# Patient Record
Sex: Female | Born: 1945 | Race: White | Hispanic: No | Marital: Married | Smoking: Never smoker
Health system: Southern US, Community
[De-identification: ages and names within clinical notes are randomized; demographics above are authoritative.]

## PROBLEM LIST (undated history)

## (undated) ENCOUNTER — Emergency Department (HOSPITAL_COMMUNITY): Payer: Self-pay | Source: Home / Self Care

## (undated) DIAGNOSIS — I729 Aneurysm of unspecified site: Secondary | ICD-10-CM

## (undated) DIAGNOSIS — K589 Irritable bowel syndrome without diarrhea: Secondary | ICD-10-CM

## (undated) DIAGNOSIS — Z860100 Personal history of colon polyps, unspecified: Secondary | ICD-10-CM

## (undated) DIAGNOSIS — G8929 Other chronic pain: Secondary | ICD-10-CM

## (undated) DIAGNOSIS — R51 Headache: Secondary | ICD-10-CM

## (undated) DIAGNOSIS — R519 Headache, unspecified: Secondary | ICD-10-CM

## (undated) DIAGNOSIS — I10 Essential (primary) hypertension: Secondary | ICD-10-CM

## (undated) DIAGNOSIS — K219 Gastro-esophageal reflux disease without esophagitis: Secondary | ICD-10-CM

## (undated) DIAGNOSIS — J449 Chronic obstructive pulmonary disease, unspecified: Secondary | ICD-10-CM

## (undated) DIAGNOSIS — F419 Anxiety disorder, unspecified: Secondary | ICD-10-CM

## (undated) DIAGNOSIS — E785 Hyperlipidemia, unspecified: Secondary | ICD-10-CM

## (undated) HISTORY — PX: CATARACT EXTRACTION: SUR2

## (undated) HISTORY — DX: Gastro-esophageal reflux disease without esophagitis: K21.9

## (undated) HISTORY — DX: Anxiety disorder, unspecified: F41.9

## (undated) HISTORY — PX: CEREBRAL ANEURYSM REPAIR: SHX164

## (undated) HISTORY — DX: Personal history of colon polyps, unspecified: Z86.0100

## (undated) HISTORY — DX: Headache, unspecified: G89.29

## (undated) HISTORY — DX: Aneurysm of unspecified site: I72.9

## (undated) HISTORY — DX: Essential (primary) hypertension: I10

## (undated) HISTORY — DX: Hyperlipidemia, unspecified: E78.5

## (undated) HISTORY — DX: Personal history of colonic polyps: Z86.010

## (undated) HISTORY — DX: Irritable bowel syndrome, unspecified: K58.9

## (undated) HISTORY — DX: Headache: R51

## (undated) HISTORY — DX: Headache, unspecified: R51.9

## (undated) HISTORY — DX: Chronic obstructive pulmonary disease, unspecified: J44.9

---

## 1981-09-26 HISTORY — PX: TOTAL ABDOMINAL HYSTERECTOMY: SHX209

## 1998-01-19 ENCOUNTER — Other Ambulatory Visit: Admission: RE | Admit: 1998-01-19 | Discharge: 1998-01-19 | Payer: Self-pay | Admitting: Obstetrics and Gynecology

## 1998-09-26 DIAGNOSIS — I729 Aneurysm of unspecified site: Secondary | ICD-10-CM | POA: Insufficient documentation

## 1998-09-26 HISTORY — DX: Aneurysm of unspecified site: I72.9

## 1999-08-31 ENCOUNTER — Other Ambulatory Visit: Admission: RE | Admit: 1999-08-31 | Discharge: 1999-08-31 | Payer: Self-pay | Admitting: Obstetrics and Gynecology

## 1999-09-27 HISTORY — PX: CHOLECYSTECTOMY: SHX55

## 2013-05-15 ENCOUNTER — Encounter: Payer: Self-pay | Admitting: Internal Medicine

## 2013-05-16 ENCOUNTER — Ambulatory Visit (INDEPENDENT_AMBULATORY_CARE_PROVIDER_SITE_OTHER): Payer: Medicare Other | Admitting: Internal Medicine

## 2013-05-16 ENCOUNTER — Encounter: Payer: Self-pay | Admitting: Internal Medicine

## 2013-05-16 VITALS — BP 122/81 | HR 53 | Temp 98.3°F | Ht 65.5 in | Wt 177.0 lb

## 2013-05-16 DIAGNOSIS — I10 Essential (primary) hypertension: Secondary | ICD-10-CM

## 2013-05-16 DIAGNOSIS — R0609 Other forms of dyspnea: Secondary | ICD-10-CM

## 2013-05-16 DIAGNOSIS — R0600 Dyspnea, unspecified: Secondary | ICD-10-CM

## 2013-05-16 MED ORDER — NEBIVOLOL HCL 5 MG PO TABS: 5.0000 mg | ORAL_TABLET | Freq: Every day | ORAL | Status: DC

## 2013-05-16 MED ORDER — FAMOTIDINE 20 MG PO TABS: ORAL_TABLET | ORAL | Status: DC

## 2013-05-16 NOTE — Progress Notes (Signed)
  Subjective:    Patient ID: Abigail Foster, female    DOB: 1945-11-27   MRN: [id number]  HPI  39 yowf never smoker but freq bronchitis starting around the age of 33 typically starts as cold and then needs to go to doctor for abx maybe once a year and sob swimming for decades referred by Dr Abner Greenspan for new doe to pulmonary clinic  05/16/2013 for sob.   05/16/2013 1st pulmonary eval cc doe uphills on usual work x one year indolent progressive assoc with increase in noct dry cough and nasal congestion . rx with tudorza but not sure now to use it.. Assoc with voice fatigue. W/u so far has shown restriction with low dlco but no xrays avail at ov.   Denies h/o connective tissue dz, amiodarone or chemo or macrodantin exposure.   No obvious daytime variabilty or assoc  cp or chest tightness, subjective wheeze overt sinus or hb symptoms. No unusual exp hx or h/o childhood pna/ asthma or knowledge of premature birth.  Sleeping ok without nocturnal  or early am exacerbation  of respiratory  c/o's or need for noct saba. Also denies any obvious fluctuation of symptoms with weather or environmental changes or other aggravating or alleviating factors except as outlined above      Review of Systems  Constitutional: Negative for fever and unexpected weight change.  HENT: Positive for ear pain, congestion, sneezing and postnasal drip. Negative for nosebleeds, sore throat, rhinorrhea, trouble swallowing, dental problem and sinus pressure.   Eyes: Negative for redness and itching.  Respiratory: Positive for cough, shortness of breath and wheezing. Negative for chest tightness.   Cardiovascular: Negative for palpitations and leg swelling.  Gastrointestinal: Negative for nausea and vomiting.  Genitourinary: Negative for dysuria.  Musculoskeletal: Negative for joint swelling.  Skin: Negative for rash.  Neurological: Positive for headaches.  Hematological: Does not bruise/bleed easily.  Psychiatric/Behavioral:  Positive for dysphoric mood. The patient is not nervous/anxious.        Objective:   Physical Exam  Wt Readings from Last 3 Encounters:  05/16/13 177 lb (80.287 kg)     amb wf nad   HEENT: nl dentition, turbinates, and orophanx. Nl external ear canals without cough reflex   NECK :  without JVD/Nodes/TM/ nl carotid upstrokes bilaterally   LUNGS: no acc muscle use, clear to A and P bilaterally without cough on insp or exp maneuvers   CV:  RRR  no s3 or murmur or increase in P2, no edema   ABD:  soft and nontender with nl excursion in the supine position. No bruits or organomegaly, bowel sounds nl  MS:  warm without deformities, calf tenderness, cyanosis or clubbing  SKIN: warm and dry without lesions    NEURO:  alert, approp, no deficits    xrays not avail at ov      Assessment & Plan:

## 2013-05-16 NOTE — Patient Instructions (Addendum)
Prilosec 20 mg take 2 Take 30-60 min before first meal of the day and pepcid ac 20 mg at bedtime  Stop coreg (corevidol)  Bystolic 5 mg one daily in place of corevidol  GERD (REFLUX)  is an extremely common cause of respiratory symptoms, many times with no significant heartburn at all.    It can be treated with medication, but also with lifestyle changes including avoidance of late meals, excessive alcohol, smoking cessation, and avoid fatty foods, chocolate, peppermint, colas, red wine, and acidic juices such as orange juice.  NO MINT OR MENTHOL PRODUCTS SO NO COUGH DROPS  USE SUGARLESS CANDY INSTEAD (jolley ranchers or Stover's)  NO OIL BASED VITAMINS - use powdered substitutes - Stop krill oil   Stop tudorza if you don't feel it's helping   Please schedule a follow up office visit in 4 weeks, sooner if needed with pft's

## 2013-05-19 DIAGNOSIS — R0600 Dyspnea, unspecified: Secondary | ICD-10-CM | POA: Insufficient documentation

## 2013-05-19 HISTORY — DX: Dyspnea, unspecified: R06.00

## 2013-05-19 NOTE — Assessment & Plan Note (Addendum)
PFT's reviewed and suggest restriction rather than obst but note symptoms are markedly disproportionate to objective findings and not clear this is a lung problem but pt does appear to have difficult airway management issues.  DDX of  difficult airways managment all start with A and  include Adherence, Ace Inhibitors, Acid Reflux, Active Sinus Disease, Alpha 1 Antitripsin deficiency, Anxiety masquerading as Airways dz,  ABPA,  allergy(esp in young), Aspiration (esp in elderly), Adverse effects of DPI,  Active smokers, plus two Bs  = Bronchiectasis and Beta blocker use..and one C= CHF  Will try on gerd rx and change her BB to bystolic and then regroup with pft's on return as well as request ct report from [facility]

## 2013-05-23 ENCOUNTER — Telehealth: Payer: Self-pay | Admitting: Internal Medicine

## 2013-05-23 NOTE — Telephone Encounter (Signed)
I spoke with pt and made aware of recs. Nothing further needed

## 2013-05-23 NOTE — Telephone Encounter (Signed)
I spoke with pt. She stated she took 2 Prilosec's in the AM she feels nauseated. She states it last for couple hours. She stated she noticed this about 2 days ago. The nausea lasts on and off all day. She is wanting to know if she take 1 in the AM and 1 before lunch. Please advise MW thanks

## 2013-05-23 NOTE — Telephone Encounter (Signed)
Try  Take 30- 60 min before your first and last meals of the day

## 2013-05-28 ENCOUNTER — Encounter: Payer: Self-pay | Admitting: Internal Medicine

## 2013-05-28 DIAGNOSIS — R911 Solitary pulmonary nodule: Secondary | ICD-10-CM

## 2013-05-28 HISTORY — DX: Solitary pulmonary nodule: R91.1

## 2013-06-13 ENCOUNTER — Telehealth: Payer: Self-pay | Admitting: Internal Medicine

## 2013-06-13 MED ORDER — NEBIVOLOL HCL 5 MG PO TABS: 5.0000 mg | ORAL_TABLET | Freq: Every day | ORAL | Status: DC

## 2013-06-13 NOTE — Telephone Encounter (Signed)
Pt advised that she needs to continue bystolic and rx sent. Carron Curie, CMA

## 2013-06-27 ENCOUNTER — Ambulatory Visit (INDEPENDENT_AMBULATORY_CARE_PROVIDER_SITE_OTHER): Payer: Medicare Other | Admitting: Internal Medicine

## 2013-06-27 ENCOUNTER — Encounter: Payer: Self-pay | Admitting: Internal Medicine

## 2013-06-27 VITALS — BP 116/64 | HR 50 | Temp 97.7°F | Ht 64.25 in | Wt 176.0 lb

## 2013-06-27 DIAGNOSIS — J45909 Unspecified asthma, uncomplicated: Secondary | ICD-10-CM

## 2013-06-27 DIAGNOSIS — I10 Essential (primary) hypertension: Secondary | ICD-10-CM

## 2013-06-27 DIAGNOSIS — R911 Solitary pulmonary nodule: Secondary | ICD-10-CM

## 2013-06-27 DIAGNOSIS — Z23 Encounter for immunization: Secondary | ICD-10-CM

## 2013-06-27 DIAGNOSIS — R0600 Dyspnea, unspecified: Secondary | ICD-10-CM

## 2013-06-27 DIAGNOSIS — R0609 Other forms of dyspnea: Secondary | ICD-10-CM

## 2013-06-27 HISTORY — DX: Unspecified asthma, uncomplicated: J45.909

## 2013-06-27 LAB — PULMONARY FUNCTION TEST

## 2013-06-27 MED ORDER — MOMETASONE FURO-FORMOTEROL FUM 100-5 MCG/ACT IN AERO: INHALATION_SPRAY | RESPIRATORY_TRACT | Status: DC

## 2013-06-27 NOTE — Patient Instructions (Addendum)
Try dulera 100 Take 1-2 puffs first thing in am and then another 2 puffs about 12 hours later.   If nausea and diarrhea don't improve try stopping the prilosec (omeprazole) and use pepcid 20 mg twice daily    If you are satisfied with your treatment plan let your doctor know and he/she can either refill your medications or you can return here when your prescription runs out.     If in any way you are not 100% satisfied,  please tell us.  If 100% better, tell your friends!

## 2013-06-27 NOTE — Progress Notes (Signed)
Subjective:    Patient ID: Abigail Foster, female    DOB: 1946-04-07   MRN: [id number]    Brief patient profile:  89 yowf never smoker but freq bronchitis starting around the age of 84 typically starts as cold and then needs to go to doctor for abx maybe once a year and sob swimming for decades referred by Dr Abner Greenspan for new doe to pulmonary clinic  05/16/2013 with sob ? etiology  History of Present Illness  05/16/2013 1st pulmonary eval cc doe uphills on usual walk x one year indolent progressive assoc with increase in noct dry cough and nasal congestion . rx with tudorza but not sure now to use it.. Assoc with voice fatigue. W/u so far has shown restriction with low dlco but no xrays avail at ov. rec Prilosec 20 mg take 2 Take 30-60 min before first meal of the day and pepcid ac 20 mg at bedtime Stop coreg (corevidol) Bystolic 5 mg one daily in place of corevidol GERD    Stop tudorza if you don't feel it's helping        06/27/2013 f/u ov/Tanaysha Alkins re: ? pf Chief Complaint  Patient presents with  . Followup with PFT    Pt states that her cough and SOB are better. She does c/o nausea and diarrhea and she relates this to new meds- omeprazole or bystolic.   uphills better doe, cough some better and tends to flare while sleeping    Denies h/o connective tissue dz, amiodarone or chemo or macrodantin exposure.   No obvious day to day or daytime variabilty or assoc chronic cough or cp or chest tightness, subjective wheeze overt sinus or hb symptoms. No unusual exp hx or h/o childhood pna/ asthma or knowledge of premature birth.  Sleeping ok without nocturnal  or early am exacerbation  of respiratory  c/o's or need for noct saba. Also denies any obvious fluctuation of symptoms with weather or environmental changes or other aggravating or alleviating factors except as outlined above   Current Medications, Allergies, Complete Past Medical History, Past Surgical History, Family History, and Social  History were reviewed in [employer] record.  ROS  The following are not active complaints unless bolded sore throat, dysphagia, dental problems, itching, sneezing,  nasal congestion or excess/ purulent secretions, ear ache,   fever, chills, sweats, unintended wt loss, pleuritic or exertional cp, hemoptysis,  orthopnea pnd or leg swelling, presyncope, palpitations, heartburn, abdominal pain, anorexia, nausea, vomiting, diarrhea  or change in bowel or urinary habits, change in stools or urine, dysuria,hematuria,  rash, arthralgias, visual complaints, headache, numbness weakness or ataxia or problems with walking or coordination,  change in mood/affect or memory.                 Objective:   Physical Exam  Wt Readings from Last 3 Encounters:  06/27/13 176 lb (79.833 kg)  05/16/13 177 lb (80.287 kg)        amb wf nad   HEENT: nl dentition, turbinates, and orophanx. Nl external ear canals without cough reflex   NECK :  without JVD/Nodes/TM/ nl carotid upstrokes bilaterally   LUNGS: no acc muscle use, clear to A and P bilaterally without cough on insp or exp maneuvers   CV:  RRR  no s3 or murmur or increase in P2, no edema   ABD:  soft and nontender with nl excursion in the supine position. No bruits or organomegaly, bowel sounds nl  MS:  warm  without deformities, calf tenderness, cyanosis or clubbing  SKIN: warm and dry without lesions    NEURO:  alert, approp, no deficits    04/29/13 CT chest report 5 mm nodule only, no emphysema, no ILD      Assessment & Plan:

## 2013-06-27 NOTE — Progress Notes (Signed)
PFT done today. 

## 2013-06-30 DIAGNOSIS — I119 Hypertensive heart disease without heart failure: Secondary | ICD-10-CM | POA: Insufficient documentation

## 2013-06-30 DIAGNOSIS — I10 Essential (primary) hypertension: Secondary | ICD-10-CM | POA: Insufficient documentation

## 2013-06-30 HISTORY — DX: Essential (primary) hypertension: I10

## 2013-06-30 NOTE — Assessment & Plan Note (Addendum)
-   PFT's 06/27/2013  FEV1 1.68 (69%) ratio 66 and 12% better p B2 and DLCO 71 corrects to 81  - hfa 50% p coaching 06/27/2013   Her symptoms have improved off coreg and on ppi but based on pft's the d/c coreg was probably more important and her recurrent bronchitis suggest more of a chronic asthma with flare pattern, though time will tell if this is correct  For now rec maintain on the most selective BB's on the market (see HBP) and on dulera 100 1-2 bid and reduce acid suppression to just pepcid as may be having GI side effects from PPI  The proper method of use, as well as anticipated side effects, of a metered-dose inhaler are discussed and demonstrated to the patient. Improved effectiveness after extensive coaching during this visit to a level of approximately  50%     Each maintenance medication was reviewed in detail including most importantly the difference between maintenance and as needed and under what circumstances the prns are to be used.  Please see instructions for details which were reviewed in writing and the patient given a copy.

## 2013-06-30 NOTE — Assessment & Plan Note (Signed)
D/c coreg 05/20/13 > better 06/27/13 with reversible airflow obst on pfts  So Strongly prefer in this setting: Bystolic, the most beta -1  selective Beta blocker available in sample form, with bisoprolol the most selective generic choice  on the market.

## 2013-06-30 NOTE — Assessment & Plan Note (Signed)
CT chest 04/26/13 :   5mm LUL nodule, never smoker so 12 month f/u rec and entered into tickle file for recall 04/29/14

## 2013-07-01 NOTE — Assessment & Plan Note (Signed)
Strongly prefer in this setting: Bystolic, the most beta -1  selective Beta blocker available in sample form, with bisoprolol the most selective generic choice  on the market.  

## 2013-07-16 ENCOUNTER — Telehealth: Payer: Self-pay | Admitting: Internal Medicine

## 2013-07-16 NOTE — Telephone Encounter (Signed)
Pt states is not doing well on Breo and would like to switch back to [place]. Also, wants to know if generic for bystolic and would like a [facility] supply [facility] Please advise

## 2013-07-16 NOTE — Telephone Encounter (Signed)
Ok to try both breo and [place] combined before stop breo just to see if the two together due more than either alone  bystolic should be relatively cheap on the [facility] plan and the replacement in not a perfect fit but can try bisoprolol 5 mg daily and recheck bp 1 weeks after change Tammy NP or primary

## 2013-07-17 MED ORDER — NEBIVOLOL HCL 5 MG PO TABS: 5.0000 mg | ORAL_TABLET | Freq: Every day | ORAL | Status: DC

## 2013-07-17 NOTE — Telephone Encounter (Signed)
Pt states that she doesn't want to take Breo anymore. Something in the inhaler is causing her to be short of breath and feels like she is going to pass out. She will take [place] by itself.  [facility]stolic will be [facility] per the pt's request.

## 2013-07-18 ENCOUNTER — Telehealth: Payer: Self-pay | Admitting: Internal Medicine

## 2013-07-18 MED ORDER — NEBIVOLOL HCL 5 MG PO TABS: 5.0000 mg | ORAL_TABLET | Freq: Every day | ORAL | Status: DC

## 2013-07-18 NOTE — Telephone Encounter (Signed)
Per phone note 07/16/13: pt requested rx for bystolic to be sent [facility].   I called and spoke with pt. She reports [facility] called her and advised her they could not fill her BP medication bc she was not under them. She reports she did not request RX to go to them but to [facility]. I apologized to the pt and advised her will send this to [facility]. She voiced her understanding and needed nothing further

## 2014-04-15 ENCOUNTER — Telehealth: Payer: Self-pay | Admitting: *Deleted

## 2014-04-15 NOTE — Telephone Encounter (Signed)
Message copied by Rosana Berger on Tue Apr 15, 2014  1:05 PM ------      Message from: Christinia Gully B      Created: Tue May 28, 2013  8:34 AM       Needs limited ct no contrast f/u LUL nodule ------

## 2014-04-15 NOTE — Telephone Encounter (Signed)
Called the pt to remind her ct chest due in Aug 2015 [facility]

## 2014-04-16 ENCOUNTER — Encounter: Payer: Self-pay | Admitting: Internal Medicine

## 2014-04-16 NOTE — Telephone Encounter (Signed)
Pt refuses to have repeat CT chest. Aware that this is the recommendation of Dr Melvyn Novas  Solitary pulmonary nodule - Tanda Rockers, MD at 06/30/2013 8:21 AM     Status: Written Related Problem: Solitary pulmonary nodule    CT chest 04/26/13 : 33mm LUL nodule, never smoker so 12 month f/u rec and entered into tickle file for recall 04/29/14   Pt using Tudorza daily as directed   Please advise Dr Melvyn Novas if anything further needed for this patient. Thanks.

## 2014-04-16 NOTE — Telephone Encounter (Signed)
Ok but send copy of this note to Dr Nyra Capes - pt is low risk as never smoked

## 2014-04-16 NOTE — Telephone Encounter (Signed)
Will forward note to Dr. Marco Collie.

## 2014-04-16 NOTE — Telephone Encounter (Signed)
Pt is returning [name]'s call.  Abigail Foster

## 2014-12-22 DIAGNOSIS — M7631 Iliotibial band syndrome, right leg: Secondary | ICD-10-CM | POA: Diagnosis not present

## 2014-12-22 DIAGNOSIS — H6120 Impacted cerumen, unspecified ear: Secondary | ICD-10-CM | POA: Diagnosis not present

## 2014-12-22 DIAGNOSIS — I10 Essential (primary) hypertension: Secondary | ICD-10-CM | POA: Diagnosis not present

## 2014-12-22 DIAGNOSIS — Z6830 Body mass index (BMI) 30.0-30.9, adult: Secondary | ICD-10-CM | POA: Diagnosis not present

## 2014-12-22 DIAGNOSIS — E782 Mixed hyperlipidemia: Secondary | ICD-10-CM | POA: Diagnosis not present

## 2014-12-31 DIAGNOSIS — R7309 Other abnormal glucose: Secondary | ICD-10-CM | POA: Diagnosis not present

## 2014-12-31 DIAGNOSIS — Z0000 Encounter for general adult medical examination without abnormal findings: Secondary | ICD-10-CM | POA: Diagnosis not present

## 2014-12-31 DIAGNOSIS — Z1389 Encounter for screening for other disorder: Secondary | ICD-10-CM | POA: Diagnosis not present

## 2014-12-31 DIAGNOSIS — Z139 Encounter for screening, unspecified: Secondary | ICD-10-CM | POA: Diagnosis not present

## 2015-01-05 DIAGNOSIS — N182 Chronic kidney disease, stage 2 (mild): Secondary | ICD-10-CM | POA: Diagnosis not present

## 2015-01-05 DIAGNOSIS — R7309 Other abnormal glucose: Secondary | ICD-10-CM | POA: Diagnosis not present

## 2015-01-05 DIAGNOSIS — E785 Hyperlipidemia, unspecified: Secondary | ICD-10-CM | POA: Diagnosis not present

## 2015-01-05 DIAGNOSIS — I129 Hypertensive chronic kidney disease with stage 1 through stage 4 chronic kidney disease, or unspecified chronic kidney disease: Secondary | ICD-10-CM | POA: Diagnosis not present

## 2015-01-08 DIAGNOSIS — H6120 Impacted cerumen, unspecified ear: Secondary | ICD-10-CM | POA: Diagnosis not present

## 2015-01-14 DIAGNOSIS — J449 Chronic obstructive pulmonary disease, unspecified: Secondary | ICD-10-CM | POA: Diagnosis not present

## 2015-04-21 DIAGNOSIS — Z6827 Body mass index (BMI) 27.0-27.9, adult: Secondary | ICD-10-CM | POA: Diagnosis not present

## 2015-04-21 DIAGNOSIS — K589 Irritable bowel syndrome without diarrhea: Secondary | ICD-10-CM | POA: Diagnosis not present

## 2015-04-23 DIAGNOSIS — I10 Essential (primary) hypertension: Secondary | ICD-10-CM | POA: Diagnosis not present

## 2015-04-23 DIAGNOSIS — E785 Hyperlipidemia, unspecified: Secondary | ICD-10-CM | POA: Diagnosis not present

## 2015-05-07 DIAGNOSIS — Z6827 Body mass index (BMI) 27.0-27.9, adult: Secondary | ICD-10-CM | POA: Diagnosis not present

## 2015-05-07 DIAGNOSIS — E785 Hyperlipidemia, unspecified: Secondary | ICD-10-CM | POA: Diagnosis not present

## 2015-05-07 DIAGNOSIS — I129 Hypertensive chronic kidney disease with stage 1 through stage 4 chronic kidney disease, or unspecified chronic kidney disease: Secondary | ICD-10-CM | POA: Diagnosis not present

## 2015-05-07 DIAGNOSIS — N182 Chronic kidney disease, stage 2 (mild): Secondary | ICD-10-CM | POA: Diagnosis not present

## 2015-05-28 DIAGNOSIS — Z1329 Encounter for screening for other suspected endocrine disorder: Secondary | ICD-10-CM | POA: Diagnosis not present

## 2015-05-28 DIAGNOSIS — H6120 Impacted cerumen, unspecified ear: Secondary | ICD-10-CM | POA: Diagnosis not present

## 2015-05-28 DIAGNOSIS — R232 Flushing: Secondary | ICD-10-CM | POA: Diagnosis not present

## 2015-05-28 DIAGNOSIS — H6692 Otitis media, unspecified, left ear: Secondary | ICD-10-CM | POA: Diagnosis not present

## 2015-07-02 DIAGNOSIS — Z23 Encounter for immunization: Secondary | ICD-10-CM | POA: Diagnosis not present

## 2015-09-03 DIAGNOSIS — I129 Hypertensive chronic kidney disease with stage 1 through stage 4 chronic kidney disease, or unspecified chronic kidney disease: Secondary | ICD-10-CM | POA: Diagnosis not present

## 2015-09-03 DIAGNOSIS — N182 Chronic kidney disease, stage 2 (mild): Secondary | ICD-10-CM | POA: Diagnosis not present

## 2015-09-03 DIAGNOSIS — E785 Hyperlipidemia, unspecified: Secondary | ICD-10-CM | POA: Diagnosis not present

## 2015-09-07 DIAGNOSIS — Z0000 Encounter for general adult medical examination without abnormal findings: Secondary | ICD-10-CM | POA: Diagnosis not present

## 2015-09-07 DIAGNOSIS — Z9181 History of falling: Secondary | ICD-10-CM | POA: Diagnosis not present

## 2015-09-07 DIAGNOSIS — Z1159 Encounter for screening for other viral diseases: Secondary | ICD-10-CM | POA: Diagnosis not present

## 2015-09-07 DIAGNOSIS — Z1211 Encounter for screening for malignant neoplasm of colon: Secondary | ICD-10-CM | POA: Diagnosis not present

## 2015-09-07 DIAGNOSIS — Z1231 Encounter for screening mammogram for malignant neoplasm of breast: Secondary | ICD-10-CM | POA: Diagnosis not present

## 2015-09-11 DIAGNOSIS — R7303 Prediabetes: Secondary | ICD-10-CM | POA: Diagnosis not present

## 2015-09-11 DIAGNOSIS — Z6828 Body mass index (BMI) 28.0-28.9, adult: Secondary | ICD-10-CM | POA: Diagnosis not present

## 2015-09-11 DIAGNOSIS — I10 Essential (primary) hypertension: Secondary | ICD-10-CM | POA: Diagnosis not present

## 2015-09-11 DIAGNOSIS — E785 Hyperlipidemia, unspecified: Secondary | ICD-10-CM | POA: Diagnosis not present

## 2015-09-17 DIAGNOSIS — Z1231 Encounter for screening mammogram for malignant neoplasm of breast: Secondary | ICD-10-CM | POA: Diagnosis not present

## 2015-10-29 DIAGNOSIS — Z1211 Encounter for screening for malignant neoplasm of colon: Secondary | ICD-10-CM | POA: Diagnosis not present

## 2015-10-29 DIAGNOSIS — Z86010 Personal history of colonic polyps: Secondary | ICD-10-CM | POA: Diagnosis not present

## 2015-10-29 DIAGNOSIS — H2703 Aphakia, bilateral: Secondary | ICD-10-CM | POA: Diagnosis not present

## 2015-12-03 DIAGNOSIS — Z86010 Personal history of colonic polyps: Secondary | ICD-10-CM | POA: Diagnosis not present

## 2015-12-03 DIAGNOSIS — E7800 Pure hypercholesterolemia, unspecified: Secondary | ICD-10-CM | POA: Diagnosis not present

## 2015-12-03 DIAGNOSIS — K635 Polyp of colon: Secondary | ICD-10-CM | POA: Diagnosis not present

## 2015-12-03 DIAGNOSIS — Z1211 Encounter for screening for malignant neoplasm of colon: Secondary | ICD-10-CM | POA: Diagnosis not present

## 2015-12-03 DIAGNOSIS — K589 Irritable bowel syndrome without diarrhea: Secondary | ICD-10-CM | POA: Diagnosis not present

## 2015-12-03 DIAGNOSIS — E785 Hyperlipidemia, unspecified: Secondary | ICD-10-CM | POA: Diagnosis not present

## 2015-12-03 DIAGNOSIS — K648 Other hemorrhoids: Secondary | ICD-10-CM | POA: Diagnosis not present

## 2015-12-03 DIAGNOSIS — I10 Essential (primary) hypertension: Secondary | ICD-10-CM | POA: Diagnosis not present

## 2015-12-03 DIAGNOSIS — K5730 Diverticulosis of large intestine without perforation or abscess without bleeding: Secondary | ICD-10-CM | POA: Diagnosis not present

## 2015-12-03 DIAGNOSIS — J45909 Unspecified asthma, uncomplicated: Secondary | ICD-10-CM | POA: Diagnosis not present

## 2015-12-03 DIAGNOSIS — D125 Benign neoplasm of sigmoid colon: Secondary | ICD-10-CM | POA: Diagnosis not present

## 2015-12-03 HISTORY — PX: COLONOSCOPY: SHX5424

## 2016-01-07 DIAGNOSIS — I129 Hypertensive chronic kidney disease with stage 1 through stage 4 chronic kidney disease, or unspecified chronic kidney disease: Secondary | ICD-10-CM | POA: Diagnosis not present

## 2016-01-07 DIAGNOSIS — N182 Chronic kidney disease, stage 2 (mild): Secondary | ICD-10-CM | POA: Diagnosis not present

## 2016-01-07 DIAGNOSIS — Z139 Encounter for screening, unspecified: Secondary | ICD-10-CM | POA: Diagnosis not present

## 2016-01-07 DIAGNOSIS — Z1389 Encounter for screening for other disorder: Secondary | ICD-10-CM | POA: Diagnosis not present

## 2016-01-07 DIAGNOSIS — E785 Hyperlipidemia, unspecified: Secondary | ICD-10-CM | POA: Diagnosis not present

## 2016-01-07 DIAGNOSIS — Z0000 Encounter for general adult medical examination without abnormal findings: Secondary | ICD-10-CM | POA: Diagnosis not present

## 2016-01-21 DIAGNOSIS — J449 Chronic obstructive pulmonary disease, unspecified: Secondary | ICD-10-CM | POA: Diagnosis not present

## 2016-01-21 DIAGNOSIS — I129 Hypertensive chronic kidney disease with stage 1 through stage 4 chronic kidney disease, or unspecified chronic kidney disease: Secondary | ICD-10-CM | POA: Diagnosis not present

## 2016-01-21 DIAGNOSIS — E785 Hyperlipidemia, unspecified: Secondary | ICD-10-CM | POA: Diagnosis not present

## 2016-01-21 DIAGNOSIS — N182 Chronic kidney disease, stage 2 (mild): Secondary | ICD-10-CM | POA: Diagnosis not present

## 2016-01-21 DIAGNOSIS — Z7189 Other specified counseling: Secondary | ICD-10-CM | POA: Diagnosis not present

## 2016-02-09 DIAGNOSIS — N949 Unspecified condition associated with female genital organs and menstrual cycle: Secondary | ICD-10-CM | POA: Diagnosis not present

## 2016-02-09 DIAGNOSIS — R232 Flushing: Secondary | ICD-10-CM | POA: Diagnosis not present

## 2016-02-09 DIAGNOSIS — Z6828 Body mass index (BMI) 28.0-28.9, adult: Secondary | ICD-10-CM | POA: Diagnosis not present

## 2016-02-09 DIAGNOSIS — R300 Dysuria: Secondary | ICD-10-CM | POA: Diagnosis not present

## 2016-02-18 DIAGNOSIS — Z6828 Body mass index (BMI) 28.0-28.9, adult: Secondary | ICD-10-CM | POA: Diagnosis not present

## 2016-02-18 DIAGNOSIS — J449 Chronic obstructive pulmonary disease, unspecified: Secondary | ICD-10-CM | POA: Diagnosis not present

## 2016-02-23 DIAGNOSIS — J449 Chronic obstructive pulmonary disease, unspecified: Secondary | ICD-10-CM | POA: Diagnosis not present

## 2016-02-23 DIAGNOSIS — Z6828 Body mass index (BMI) 28.0-28.9, adult: Secondary | ICD-10-CM | POA: Diagnosis not present

## 2016-03-25 DIAGNOSIS — R232 Flushing: Secondary | ICD-10-CM | POA: Diagnosis not present

## 2016-03-25 DIAGNOSIS — N952 Postmenopausal atrophic vaginitis: Secondary | ICD-10-CM | POA: Diagnosis not present

## 2016-03-25 DIAGNOSIS — Z6828 Body mass index (BMI) 28.0-28.9, adult: Secondary | ICD-10-CM | POA: Diagnosis not present

## 2016-04-18 DIAGNOSIS — H2703 Aphakia, bilateral: Secondary | ICD-10-CM | POA: Diagnosis not present

## 2016-04-21 DIAGNOSIS — Z9181 History of falling: Secondary | ICD-10-CM | POA: Diagnosis not present

## 2016-04-21 DIAGNOSIS — J449 Chronic obstructive pulmonary disease, unspecified: Secondary | ICD-10-CM | POA: Diagnosis not present

## 2016-04-21 DIAGNOSIS — Z6828 Body mass index (BMI) 28.0-28.9, adult: Secondary | ICD-10-CM | POA: Diagnosis not present

## 2016-05-19 DIAGNOSIS — I129 Hypertensive chronic kidney disease with stage 1 through stage 4 chronic kidney disease, or unspecified chronic kidney disease: Secondary | ICD-10-CM | POA: Diagnosis not present

## 2016-05-19 DIAGNOSIS — E785 Hyperlipidemia, unspecified: Secondary | ICD-10-CM | POA: Diagnosis not present

## 2016-05-24 DIAGNOSIS — J449 Chronic obstructive pulmonary disease, unspecified: Secondary | ICD-10-CM | POA: Diagnosis not present

## 2016-05-24 DIAGNOSIS — Z6828 Body mass index (BMI) 28.0-28.9, adult: Secondary | ICD-10-CM | POA: Diagnosis not present

## 2016-07-04 DIAGNOSIS — J0110 Acute frontal sinusitis, unspecified: Secondary | ICD-10-CM | POA: Diagnosis not present

## 2016-07-04 DIAGNOSIS — Z23 Encounter for immunization: Secondary | ICD-10-CM | POA: Diagnosis not present

## 2016-07-04 DIAGNOSIS — J441 Chronic obstructive pulmonary disease with (acute) exacerbation: Secondary | ICD-10-CM | POA: Diagnosis not present

## 2016-07-04 DIAGNOSIS — Z6829 Body mass index (BMI) 29.0-29.9, adult: Secondary | ICD-10-CM | POA: Diagnosis not present

## 2016-08-04 DIAGNOSIS — Z6828 Body mass index (BMI) 28.0-28.9, adult: Secondary | ICD-10-CM | POA: Diagnosis not present

## 2016-08-04 DIAGNOSIS — I671 Cerebral aneurysm, nonruptured: Secondary | ICD-10-CM | POA: Diagnosis not present

## 2016-08-12 DIAGNOSIS — Z9889 Other specified postprocedural states: Secondary | ICD-10-CM | POA: Diagnosis not present

## 2016-08-12 DIAGNOSIS — R51 Headache: Secondary | ICD-10-CM | POA: Diagnosis not present

## 2016-08-12 DIAGNOSIS — I671 Cerebral aneurysm, nonruptured: Secondary | ICD-10-CM | POA: Diagnosis not present

## 2016-08-12 DIAGNOSIS — H538 Other visual disturbances: Secondary | ICD-10-CM | POA: Diagnosis not present

## 2016-08-12 DIAGNOSIS — R42 Dizziness and giddiness: Secondary | ICD-10-CM | POA: Diagnosis not present

## 2016-08-12 DIAGNOSIS — Z8679 Personal history of other diseases of the circulatory system: Secondary | ICD-10-CM | POA: Diagnosis not present

## 2016-08-12 DIAGNOSIS — R590 Localized enlarged lymph nodes: Secondary | ICD-10-CM | POA: Diagnosis not present

## 2016-08-12 DIAGNOSIS — R2689 Other abnormalities of gait and mobility: Secondary | ICD-10-CM | POA: Diagnosis not present

## 2016-08-12 DIAGNOSIS — M5030 Other cervical disc degeneration, unspecified cervical region: Secondary | ICD-10-CM | POA: Diagnosis not present

## 2016-08-26 DIAGNOSIS — Z01419 Encounter for gynecological examination (general) (routine) without abnormal findings: Secondary | ICD-10-CM | POA: Diagnosis not present

## 2016-08-26 DIAGNOSIS — Z0000 Encounter for general adult medical examination without abnormal findings: Secondary | ICD-10-CM | POA: Diagnosis not present

## 2016-08-26 DIAGNOSIS — Z1231 Encounter for screening mammogram for malignant neoplasm of breast: Secondary | ICD-10-CM | POA: Diagnosis not present

## 2016-08-26 DIAGNOSIS — N898 Other specified noninflammatory disorders of vagina: Secondary | ICD-10-CM | POA: Diagnosis not present

## 2016-08-26 DIAGNOSIS — Z1210 Encounter for screening for malignant neoplasm of intestinal tract, unspecified: Secondary | ICD-10-CM | POA: Diagnosis not present

## 2016-09-06 DIAGNOSIS — J449 Chronic obstructive pulmonary disease, unspecified: Secondary | ICD-10-CM | POA: Diagnosis not present

## 2016-09-22 DIAGNOSIS — Z1231 Encounter for screening mammogram for malignant neoplasm of breast: Secondary | ICD-10-CM | POA: Diagnosis not present

## 2016-09-29 DIAGNOSIS — E785 Hyperlipidemia, unspecified: Secondary | ICD-10-CM | POA: Diagnosis not present

## 2016-09-29 DIAGNOSIS — R7303 Prediabetes: Secondary | ICD-10-CM | POA: Diagnosis not present

## 2016-09-29 DIAGNOSIS — I129 Hypertensive chronic kidney disease with stage 1 through stage 4 chronic kidney disease, or unspecified chronic kidney disease: Secondary | ICD-10-CM | POA: Diagnosis not present

## 2016-10-06 DIAGNOSIS — I129 Hypertensive chronic kidney disease with stage 1 through stage 4 chronic kidney disease, or unspecified chronic kidney disease: Secondary | ICD-10-CM | POA: Diagnosis not present

## 2016-10-06 DIAGNOSIS — G43009 Migraine without aura, not intractable, without status migrainosus: Secondary | ICD-10-CM | POA: Diagnosis not present

## 2016-10-06 DIAGNOSIS — J449 Chronic obstructive pulmonary disease, unspecified: Secondary | ICD-10-CM | POA: Diagnosis not present

## 2016-10-06 DIAGNOSIS — E785 Hyperlipidemia, unspecified: Secondary | ICD-10-CM | POA: Diagnosis not present

## 2016-10-20 DIAGNOSIS — K219 Gastro-esophageal reflux disease without esophagitis: Secondary | ICD-10-CM | POA: Diagnosis not present

## 2016-10-20 DIAGNOSIS — J449 Chronic obstructive pulmonary disease, unspecified: Secondary | ICD-10-CM | POA: Diagnosis not present

## 2016-10-24 DIAGNOSIS — J441 Chronic obstructive pulmonary disease with (acute) exacerbation: Secondary | ICD-10-CM | POA: Diagnosis not present

## 2016-10-24 DIAGNOSIS — R0602 Shortness of breath: Secondary | ICD-10-CM | POA: Diagnosis not present

## 2016-10-27 DIAGNOSIS — N6002 Solitary cyst of left breast: Secondary | ICD-10-CM | POA: Diagnosis not present

## 2016-10-27 DIAGNOSIS — N6489 Other specified disorders of breast: Secondary | ICD-10-CM | POA: Diagnosis not present

## 2016-10-27 DIAGNOSIS — Z6829 Body mass index (BMI) 29.0-29.9, adult: Secondary | ICD-10-CM | POA: Diagnosis not present

## 2016-10-27 DIAGNOSIS — R928 Other abnormal and inconclusive findings on diagnostic imaging of breast: Secondary | ICD-10-CM | POA: Diagnosis not present

## 2016-10-27 DIAGNOSIS — J441 Chronic obstructive pulmonary disease with (acute) exacerbation: Secondary | ICD-10-CM | POA: Diagnosis not present

## 2016-11-10 DIAGNOSIS — K589 Irritable bowel syndrome without diarrhea: Secondary | ICD-10-CM | POA: Diagnosis not present

## 2016-11-10 DIAGNOSIS — E785 Hyperlipidemia, unspecified: Secondary | ICD-10-CM | POA: Diagnosis not present

## 2016-11-10 DIAGNOSIS — I10 Essential (primary) hypertension: Secondary | ICD-10-CM | POA: Diagnosis not present

## 2016-11-10 DIAGNOSIS — J449 Chronic obstructive pulmonary disease, unspecified: Secondary | ICD-10-CM | POA: Diagnosis not present

## 2016-12-07 DIAGNOSIS — Z139 Encounter for screening, unspecified: Secondary | ICD-10-CM | POA: Diagnosis not present

## 2016-12-07 DIAGNOSIS — Z6829 Body mass index (BMI) 29.0-29.9, adult: Secondary | ICD-10-CM | POA: Diagnosis not present

## 2016-12-07 DIAGNOSIS — L821 Other seborrheic keratosis: Secondary | ICD-10-CM | POA: Diagnosis not present

## 2016-12-07 DIAGNOSIS — J449 Chronic obstructive pulmonary disease, unspecified: Secondary | ICD-10-CM | POA: Diagnosis not present

## 2016-12-19 DIAGNOSIS — L820 Inflamed seborrheic keratosis: Secondary | ICD-10-CM | POA: Diagnosis not present

## 2016-12-22 DIAGNOSIS — H2703 Aphakia, bilateral: Secondary | ICD-10-CM | POA: Diagnosis not present

## 2016-12-28 DIAGNOSIS — Z0000 Encounter for general adult medical examination without abnormal findings: Secondary | ICD-10-CM | POA: Diagnosis not present

## 2016-12-28 DIAGNOSIS — Z139 Encounter for screening, unspecified: Secondary | ICD-10-CM | POA: Diagnosis not present

## 2017-01-09 DIAGNOSIS — L820 Inflamed seborrheic keratosis: Secondary | ICD-10-CM | POA: Diagnosis not present

## 2017-01-09 DIAGNOSIS — L918 Other hypertrophic disorders of the skin: Secondary | ICD-10-CM | POA: Diagnosis not present

## 2017-01-24 DIAGNOSIS — N182 Chronic kidney disease, stage 2 (mild): Secondary | ICD-10-CM | POA: Diagnosis not present

## 2017-01-24 DIAGNOSIS — I129 Hypertensive chronic kidney disease with stage 1 through stage 4 chronic kidney disease, or unspecified chronic kidney disease: Secondary | ICD-10-CM | POA: Diagnosis not present

## 2017-01-25 DIAGNOSIS — L918 Other hypertrophic disorders of the skin: Secondary | ICD-10-CM | POA: Diagnosis not present

## 2017-01-25 DIAGNOSIS — L821 Other seborrheic keratosis: Secondary | ICD-10-CM | POA: Diagnosis not present

## 2017-02-09 DIAGNOSIS — D1801 Hemangioma of skin and subcutaneous tissue: Secondary | ICD-10-CM | POA: Diagnosis not present

## 2017-02-09 DIAGNOSIS — D485 Neoplasm of uncertain behavior of skin: Secondary | ICD-10-CM | POA: Diagnosis not present

## 2017-02-09 DIAGNOSIS — L820 Inflamed seborrheic keratosis: Secondary | ICD-10-CM | POA: Diagnosis not present

## 2017-02-24 DIAGNOSIS — I129 Hypertensive chronic kidney disease with stage 1 through stage 4 chronic kidney disease, or unspecified chronic kidney disease: Secondary | ICD-10-CM | POA: Diagnosis not present

## 2017-02-24 DIAGNOSIS — N182 Chronic kidney disease, stage 2 (mild): Secondary | ICD-10-CM | POA: Diagnosis not present

## 2017-02-24 DIAGNOSIS — Z789 Other specified health status: Secondary | ICD-10-CM | POA: Diagnosis not present

## 2017-02-24 DIAGNOSIS — E785 Hyperlipidemia, unspecified: Secondary | ICD-10-CM | POA: Diagnosis not present

## 2017-03-17 DIAGNOSIS — R7303 Prediabetes: Secondary | ICD-10-CM | POA: Diagnosis not present

## 2017-03-17 DIAGNOSIS — E785 Hyperlipidemia, unspecified: Secondary | ICD-10-CM | POA: Diagnosis not present

## 2017-03-17 DIAGNOSIS — I129 Hypertensive chronic kidney disease with stage 1 through stage 4 chronic kidney disease, or unspecified chronic kidney disease: Secondary | ICD-10-CM | POA: Diagnosis not present

## 2017-03-24 DIAGNOSIS — I129 Hypertensive chronic kidney disease with stage 1 through stage 4 chronic kidney disease, or unspecified chronic kidney disease: Secondary | ICD-10-CM | POA: Diagnosis not present

## 2017-04-06 DIAGNOSIS — I129 Hypertensive chronic kidney disease with stage 1 through stage 4 chronic kidney disease, or unspecified chronic kidney disease: Secondary | ICD-10-CM | POA: Diagnosis not present

## 2017-04-06 DIAGNOSIS — N182 Chronic kidney disease, stage 2 (mild): Secondary | ICD-10-CM | POA: Diagnosis not present

## 2017-04-06 DIAGNOSIS — E785 Hyperlipidemia, unspecified: Secondary | ICD-10-CM | POA: Diagnosis not present

## 2017-04-06 DIAGNOSIS — Z1389 Encounter for screening for other disorder: Secondary | ICD-10-CM | POA: Diagnosis not present

## 2017-04-06 DIAGNOSIS — Z139 Encounter for screening, unspecified: Secondary | ICD-10-CM | POA: Diagnosis not present

## 2017-04-06 DIAGNOSIS — J449 Chronic obstructive pulmonary disease, unspecified: Secondary | ICD-10-CM | POA: Diagnosis not present

## 2017-06-29 DIAGNOSIS — Z23 Encounter for immunization: Secondary | ICD-10-CM | POA: Diagnosis not present

## 2017-09-07 DIAGNOSIS — E785 Hyperlipidemia, unspecified: Secondary | ICD-10-CM | POA: Diagnosis not present

## 2017-09-07 DIAGNOSIS — R7303 Prediabetes: Secondary | ICD-10-CM | POA: Diagnosis not present

## 2017-09-07 DIAGNOSIS — I129 Hypertensive chronic kidney disease with stage 1 through stage 4 chronic kidney disease, or unspecified chronic kidney disease: Secondary | ICD-10-CM | POA: Diagnosis not present

## 2017-09-14 DIAGNOSIS — Z130 Encounter for screening for diseases of the blood and blood-forming organs and certain disorders involving the immune mechanism: Secondary | ICD-10-CM | POA: Diagnosis not present

## 2017-09-14 DIAGNOSIS — J449 Chronic obstructive pulmonary disease, unspecified: Secondary | ICD-10-CM | POA: Diagnosis not present

## 2017-09-14 DIAGNOSIS — E785 Hyperlipidemia, unspecified: Secondary | ICD-10-CM | POA: Diagnosis not present

## 2017-09-14 DIAGNOSIS — R7303 Prediabetes: Secondary | ICD-10-CM | POA: Diagnosis not present

## 2017-09-14 DIAGNOSIS — Z6828 Body mass index (BMI) 28.0-28.9, adult: Secondary | ICD-10-CM | POA: Diagnosis not present

## 2017-10-27 DIAGNOSIS — J3089 Other allergic rhinitis: Secondary | ICD-10-CM | POA: Diagnosis not present

## 2017-10-27 DIAGNOSIS — E875 Hyperkalemia: Secondary | ICD-10-CM | POA: Diagnosis not present

## 2017-10-27 DIAGNOSIS — Z1211 Encounter for screening for malignant neoplasm of colon: Secondary | ICD-10-CM | POA: Diagnosis not present

## 2017-10-27 DIAGNOSIS — J301 Allergic rhinitis due to pollen: Secondary | ICD-10-CM | POA: Diagnosis not present

## 2017-10-27 DIAGNOSIS — Z0000 Encounter for general adult medical examination without abnormal findings: Secondary | ICD-10-CM | POA: Diagnosis not present

## 2017-10-27 DIAGNOSIS — E876 Hypokalemia: Secondary | ICD-10-CM | POA: Diagnosis not present

## 2017-10-27 DIAGNOSIS — Z1231 Encounter for screening mammogram for malignant neoplasm of breast: Secondary | ICD-10-CM | POA: Diagnosis not present

## 2017-10-30 DIAGNOSIS — J3089 Other allergic rhinitis: Secondary | ICD-10-CM | POA: Diagnosis not present

## 2017-10-30 DIAGNOSIS — J301 Allergic rhinitis due to pollen: Secondary | ICD-10-CM | POA: Diagnosis not present

## 2017-10-31 DIAGNOSIS — J3089 Other allergic rhinitis: Secondary | ICD-10-CM | POA: Diagnosis not present

## 2017-10-31 DIAGNOSIS — J301 Allergic rhinitis due to pollen: Secondary | ICD-10-CM | POA: Diagnosis not present

## 2017-11-01 DIAGNOSIS — J301 Allergic rhinitis due to pollen: Secondary | ICD-10-CM | POA: Diagnosis not present

## 2017-11-01 DIAGNOSIS — J3089 Other allergic rhinitis: Secondary | ICD-10-CM | POA: Diagnosis not present

## 2017-11-02 DIAGNOSIS — J301 Allergic rhinitis due to pollen: Secondary | ICD-10-CM | POA: Diagnosis not present

## 2017-11-02 DIAGNOSIS — J3089 Other allergic rhinitis: Secondary | ICD-10-CM | POA: Diagnosis not present

## 2017-11-03 DIAGNOSIS — J301 Allergic rhinitis due to pollen: Secondary | ICD-10-CM | POA: Diagnosis not present

## 2017-11-03 DIAGNOSIS — J3089 Other allergic rhinitis: Secondary | ICD-10-CM | POA: Diagnosis not present

## 2017-11-06 DIAGNOSIS — J3089 Other allergic rhinitis: Secondary | ICD-10-CM | POA: Diagnosis not present

## 2017-11-06 DIAGNOSIS — J301 Allergic rhinitis due to pollen: Secondary | ICD-10-CM | POA: Diagnosis not present

## 2017-11-07 DIAGNOSIS — J301 Allergic rhinitis due to pollen: Secondary | ICD-10-CM | POA: Diagnosis not present

## 2017-11-07 DIAGNOSIS — J3089 Other allergic rhinitis: Secondary | ICD-10-CM | POA: Diagnosis not present

## 2017-11-20 DIAGNOSIS — Z1231 Encounter for screening mammogram for malignant neoplasm of breast: Secondary | ICD-10-CM | POA: Diagnosis not present

## 2018-01-04 DIAGNOSIS — Z6828 Body mass index (BMI) 28.0-28.9, adult: Secondary | ICD-10-CM | POA: Diagnosis not present

## 2018-01-04 DIAGNOSIS — I129 Hypertensive chronic kidney disease with stage 1 through stage 4 chronic kidney disease, or unspecified chronic kidney disease: Secondary | ICD-10-CM | POA: Diagnosis not present

## 2018-01-04 DIAGNOSIS — E785 Hyperlipidemia, unspecified: Secondary | ICD-10-CM | POA: Diagnosis not present

## 2018-01-04 DIAGNOSIS — M25519 Pain in unspecified shoulder: Secondary | ICD-10-CM | POA: Diagnosis not present

## 2018-01-04 DIAGNOSIS — R7303 Prediabetes: Secondary | ICD-10-CM | POA: Diagnosis not present

## 2018-01-11 DIAGNOSIS — Z139 Encounter for screening, unspecified: Secondary | ICD-10-CM | POA: Diagnosis not present

## 2018-01-11 DIAGNOSIS — Z1331 Encounter for screening for depression: Secondary | ICD-10-CM | POA: Diagnosis not present

## 2018-01-11 DIAGNOSIS — I129 Hypertensive chronic kidney disease with stage 1 through stage 4 chronic kidney disease, or unspecified chronic kidney disease: Secondary | ICD-10-CM | POA: Diagnosis not present

## 2018-01-11 DIAGNOSIS — N182 Chronic kidney disease, stage 2 (mild): Secondary | ICD-10-CM | POA: Diagnosis not present

## 2018-01-11 DIAGNOSIS — E785 Hyperlipidemia, unspecified: Secondary | ICD-10-CM | POA: Diagnosis not present

## 2018-01-11 DIAGNOSIS — Z0000 Encounter for general adult medical examination without abnormal findings: Secondary | ICD-10-CM | POA: Diagnosis not present

## 2018-01-11 DIAGNOSIS — G43009 Migraine without aura, not intractable, without status migrainosus: Secondary | ICD-10-CM | POA: Diagnosis not present

## 2018-01-18 DIAGNOSIS — M25512 Pain in left shoulder: Secondary | ICD-10-CM | POA: Diagnosis not present

## 2018-01-18 DIAGNOSIS — M25511 Pain in right shoulder: Secondary | ICD-10-CM | POA: Diagnosis not present

## 2018-01-18 DIAGNOSIS — M25611 Stiffness of right shoulder, not elsewhere classified: Secondary | ICD-10-CM | POA: Diagnosis not present

## 2018-01-18 DIAGNOSIS — M25612 Stiffness of left shoulder, not elsewhere classified: Secondary | ICD-10-CM | POA: Diagnosis not present

## 2018-01-23 DIAGNOSIS — N182 Chronic kidney disease, stage 2 (mild): Secondary | ICD-10-CM | POA: Diagnosis not present

## 2018-01-23 DIAGNOSIS — E785 Hyperlipidemia, unspecified: Secondary | ICD-10-CM | POA: Diagnosis not present

## 2018-01-23 DIAGNOSIS — I129 Hypertensive chronic kidney disease with stage 1 through stage 4 chronic kidney disease, or unspecified chronic kidney disease: Secondary | ICD-10-CM | POA: Diagnosis not present

## 2018-01-23 DIAGNOSIS — J449 Chronic obstructive pulmonary disease, unspecified: Secondary | ICD-10-CM | POA: Diagnosis not present

## 2018-01-24 DIAGNOSIS — M25512 Pain in left shoulder: Secondary | ICD-10-CM | POA: Diagnosis not present

## 2018-01-24 DIAGNOSIS — M25611 Stiffness of right shoulder, not elsewhere classified: Secondary | ICD-10-CM | POA: Diagnosis not present

## 2018-01-24 DIAGNOSIS — M25612 Stiffness of left shoulder, not elsewhere classified: Secondary | ICD-10-CM | POA: Diagnosis not present

## 2018-01-24 DIAGNOSIS — M25511 Pain in right shoulder: Secondary | ICD-10-CM | POA: Diagnosis not present

## 2018-01-25 DIAGNOSIS — M25512 Pain in left shoulder: Secondary | ICD-10-CM | POA: Diagnosis not present

## 2018-01-25 DIAGNOSIS — M25511 Pain in right shoulder: Secondary | ICD-10-CM | POA: Diagnosis not present

## 2018-01-25 DIAGNOSIS — M25611 Stiffness of right shoulder, not elsewhere classified: Secondary | ICD-10-CM | POA: Diagnosis not present

## 2018-01-25 DIAGNOSIS — M25612 Stiffness of left shoulder, not elsewhere classified: Secondary | ICD-10-CM | POA: Diagnosis not present

## 2018-06-05 DIAGNOSIS — C44612 Basal cell carcinoma of skin of right upper limb, including shoulder: Secondary | ICD-10-CM | POA: Diagnosis not present

## 2018-06-05 DIAGNOSIS — L820 Inflamed seborrheic keratosis: Secondary | ICD-10-CM | POA: Diagnosis not present

## 2018-06-05 DIAGNOSIS — D485 Neoplasm of uncertain behavior of skin: Secondary | ICD-10-CM | POA: Diagnosis not present

## 2018-06-05 DIAGNOSIS — D225 Melanocytic nevi of trunk: Secondary | ICD-10-CM | POA: Diagnosis not present

## 2018-06-27 DIAGNOSIS — I129 Hypertensive chronic kidney disease with stage 1 through stage 4 chronic kidney disease, or unspecified chronic kidney disease: Secondary | ICD-10-CM | POA: Diagnosis not present

## 2018-06-27 DIAGNOSIS — R7303 Prediabetes: Secondary | ICD-10-CM | POA: Diagnosis not present

## 2018-06-27 DIAGNOSIS — E785 Hyperlipidemia, unspecified: Secondary | ICD-10-CM | POA: Diagnosis not present

## 2018-06-27 DIAGNOSIS — M7061 Trochanteric bursitis, right hip: Secondary | ICD-10-CM | POA: Diagnosis not present

## 2018-06-27 DIAGNOSIS — Z23 Encounter for immunization: Secondary | ICD-10-CM | POA: Diagnosis not present

## 2018-06-27 DIAGNOSIS — M62838 Other muscle spasm: Secondary | ICD-10-CM | POA: Diagnosis not present

## 2018-06-27 DIAGNOSIS — E663 Overweight: Secondary | ICD-10-CM | POA: Diagnosis not present

## 2018-07-02 DIAGNOSIS — Z6828 Body mass index (BMI) 28.0-28.9, adult: Secondary | ICD-10-CM | POA: Diagnosis not present

## 2018-07-02 DIAGNOSIS — Z139 Encounter for screening, unspecified: Secondary | ICD-10-CM | POA: Diagnosis not present

## 2018-07-02 DIAGNOSIS — M62830 Muscle spasm of back: Secondary | ICD-10-CM | POA: Diagnosis not present

## 2018-07-12 DIAGNOSIS — I129 Hypertensive chronic kidney disease with stage 1 through stage 4 chronic kidney disease, or unspecified chronic kidney disease: Secondary | ICD-10-CM | POA: Diagnosis not present

## 2018-07-12 DIAGNOSIS — N182 Chronic kidney disease, stage 2 (mild): Secondary | ICD-10-CM | POA: Diagnosis not present

## 2018-07-12 DIAGNOSIS — E876 Hypokalemia: Secondary | ICD-10-CM | POA: Diagnosis not present

## 2018-07-12 DIAGNOSIS — C44612 Basal cell carcinoma of skin of right upper limb, including shoulder: Secondary | ICD-10-CM | POA: Diagnosis not present

## 2018-07-12 DIAGNOSIS — E785 Hyperlipidemia, unspecified: Secondary | ICD-10-CM | POA: Diagnosis not present

## 2018-07-26 DIAGNOSIS — E876 Hypokalemia: Secondary | ICD-10-CM | POA: Diagnosis not present

## 2018-07-26 DIAGNOSIS — C44519 Basal cell carcinoma of skin of other part of trunk: Secondary | ICD-10-CM | POA: Diagnosis not present

## 2018-08-02 DIAGNOSIS — I129 Hypertensive chronic kidney disease with stage 1 through stage 4 chronic kidney disease, or unspecified chronic kidney disease: Secondary | ICD-10-CM | POA: Diagnosis not present

## 2018-08-02 DIAGNOSIS — Z6828 Body mass index (BMI) 28.0-28.9, adult: Secondary | ICD-10-CM | POA: Diagnosis not present

## 2018-08-02 DIAGNOSIS — N182 Chronic kidney disease, stage 2 (mild): Secondary | ICD-10-CM | POA: Diagnosis not present

## 2018-08-20 DIAGNOSIS — Z6828 Body mass index (BMI) 28.0-28.9, adult: Secondary | ICD-10-CM | POA: Diagnosis not present

## 2018-08-20 DIAGNOSIS — G43009 Migraine without aura, not intractable, without status migrainosus: Secondary | ICD-10-CM | POA: Diagnosis not present

## 2018-09-03 DIAGNOSIS — H2703 Aphakia, bilateral: Secondary | ICD-10-CM | POA: Diagnosis not present

## 2018-09-10 DIAGNOSIS — Z6829 Body mass index (BMI) 29.0-29.9, adult: Secondary | ICD-10-CM | POA: Diagnosis not present

## 2018-09-10 DIAGNOSIS — G43009 Migraine without aura, not intractable, without status migrainosus: Secondary | ICD-10-CM | POA: Diagnosis not present

## 2018-11-01 DIAGNOSIS — Z139 Encounter for screening, unspecified: Secondary | ICD-10-CM | POA: Diagnosis not present

## 2018-11-01 DIAGNOSIS — Z0000 Encounter for general adult medical examination without abnormal findings: Secondary | ICD-10-CM | POA: Diagnosis not present

## 2018-11-01 DIAGNOSIS — Z1331 Encounter for screening for depression: Secondary | ICD-10-CM | POA: Diagnosis not present

## 2018-11-01 DIAGNOSIS — Z1212 Encounter for screening for malignant neoplasm of rectum: Secondary | ICD-10-CM | POA: Diagnosis not present

## 2018-11-01 DIAGNOSIS — Z1211 Encounter for screening for malignant neoplasm of colon: Secondary | ICD-10-CM | POA: Diagnosis not present

## 2018-11-08 DIAGNOSIS — C44519 Basal cell carcinoma of skin of other part of trunk: Secondary | ICD-10-CM | POA: Diagnosis not present

## 2018-11-15 DIAGNOSIS — R350 Frequency of micturition: Secondary | ICD-10-CM | POA: Diagnosis not present

## 2018-11-15 DIAGNOSIS — N3946 Mixed incontinence: Secondary | ICD-10-CM | POA: Diagnosis not present

## 2018-11-15 DIAGNOSIS — N182 Chronic kidney disease, stage 2 (mild): Secondary | ICD-10-CM | POA: Diagnosis not present

## 2018-11-15 DIAGNOSIS — I129 Hypertensive chronic kidney disease with stage 1 through stage 4 chronic kidney disease, or unspecified chronic kidney disease: Secondary | ICD-10-CM | POA: Diagnosis not present

## 2018-11-19 DIAGNOSIS — Z6828 Body mass index (BMI) 28.0-28.9, adult: Secondary | ICD-10-CM | POA: Diagnosis not present

## 2018-11-19 DIAGNOSIS — I160 Hypertensive urgency: Secondary | ICD-10-CM | POA: Diagnosis not present

## 2018-11-26 DIAGNOSIS — Z6828 Body mass index (BMI) 28.0-28.9, adult: Secondary | ICD-10-CM | POA: Diagnosis not present

## 2018-11-26 DIAGNOSIS — I129 Hypertensive chronic kidney disease with stage 1 through stage 4 chronic kidney disease, or unspecified chronic kidney disease: Secondary | ICD-10-CM | POA: Diagnosis not present

## 2018-11-26 DIAGNOSIS — N182 Chronic kidney disease, stage 2 (mild): Secondary | ICD-10-CM | POA: Diagnosis not present

## 2018-12-02 DIAGNOSIS — G43909 Migraine, unspecified, not intractable, without status migrainosus: Secondary | ICD-10-CM | POA: Diagnosis not present

## 2018-12-02 DIAGNOSIS — I160 Hypertensive urgency: Secondary | ICD-10-CM | POA: Diagnosis not present

## 2018-12-02 DIAGNOSIS — I10 Essential (primary) hypertension: Secondary | ICD-10-CM | POA: Diagnosis not present

## 2018-12-02 DIAGNOSIS — R51 Headache: Secondary | ICD-10-CM | POA: Diagnosis not present

## 2018-12-03 DIAGNOSIS — Z1231 Encounter for screening mammogram for malignant neoplasm of breast: Secondary | ICD-10-CM | POA: Diagnosis not present

## 2018-12-07 DIAGNOSIS — Z6829 Body mass index (BMI) 29.0-29.9, adult: Secondary | ICD-10-CM | POA: Diagnosis not present

## 2018-12-07 DIAGNOSIS — N182 Chronic kidney disease, stage 2 (mild): Secondary | ICD-10-CM | POA: Diagnosis not present

## 2018-12-07 DIAGNOSIS — I129 Hypertensive chronic kidney disease with stage 1 through stage 4 chronic kidney disease, or unspecified chronic kidney disease: Secondary | ICD-10-CM | POA: Diagnosis not present

## 2018-12-25 DIAGNOSIS — N182 Chronic kidney disease, stage 2 (mild): Secondary | ICD-10-CM | POA: Diagnosis not present

## 2018-12-25 DIAGNOSIS — I129 Hypertensive chronic kidney disease with stage 1 through stage 4 chronic kidney disease, or unspecified chronic kidney disease: Secondary | ICD-10-CM | POA: Diagnosis not present

## 2019-01-08 DIAGNOSIS — E785 Hyperlipidemia, unspecified: Secondary | ICD-10-CM | POA: Diagnosis not present

## 2019-01-08 DIAGNOSIS — I129 Hypertensive chronic kidney disease with stage 1 through stage 4 chronic kidney disease, or unspecified chronic kidney disease: Secondary | ICD-10-CM | POA: Diagnosis not present

## 2019-01-08 DIAGNOSIS — R7303 Prediabetes: Secondary | ICD-10-CM | POA: Diagnosis not present

## 2019-01-17 DIAGNOSIS — N182 Chronic kidney disease, stage 2 (mild): Secondary | ICD-10-CM | POA: Diagnosis not present

## 2019-01-17 DIAGNOSIS — E785 Hyperlipidemia, unspecified: Secondary | ICD-10-CM | POA: Diagnosis not present

## 2019-01-17 DIAGNOSIS — I129 Hypertensive chronic kidney disease with stage 1 through stage 4 chronic kidney disease, or unspecified chronic kidney disease: Secondary | ICD-10-CM | POA: Diagnosis not present

## 2019-01-17 DIAGNOSIS — R7303 Prediabetes: Secondary | ICD-10-CM | POA: Diagnosis not present

## 2019-01-21 DIAGNOSIS — I129 Hypertensive chronic kidney disease with stage 1 through stage 4 chronic kidney disease, or unspecified chronic kidney disease: Secondary | ICD-10-CM | POA: Diagnosis not present

## 2019-01-21 DIAGNOSIS — N182 Chronic kidney disease, stage 2 (mild): Secondary | ICD-10-CM | POA: Diagnosis not present

## 2019-01-24 DIAGNOSIS — I129 Hypertensive chronic kidney disease with stage 1 through stage 4 chronic kidney disease, or unspecified chronic kidney disease: Secondary | ICD-10-CM | POA: Diagnosis not present

## 2019-01-24 DIAGNOSIS — E785 Hyperlipidemia, unspecified: Secondary | ICD-10-CM | POA: Diagnosis not present

## 2019-01-24 DIAGNOSIS — N182 Chronic kidney disease, stage 2 (mild): Secondary | ICD-10-CM | POA: Diagnosis not present

## 2019-01-24 DIAGNOSIS — R7303 Prediabetes: Secondary | ICD-10-CM | POA: Diagnosis not present

## 2019-02-04 DIAGNOSIS — I129 Hypertensive chronic kidney disease with stage 1 through stage 4 chronic kidney disease, or unspecified chronic kidney disease: Secondary | ICD-10-CM | POA: Diagnosis not present

## 2019-02-04 DIAGNOSIS — N182 Chronic kidney disease, stage 2 (mild): Secondary | ICD-10-CM | POA: Diagnosis not present

## 2019-02-04 DIAGNOSIS — Z7189 Other specified counseling: Secondary | ICD-10-CM | POA: Diagnosis not present

## 2019-02-11 DIAGNOSIS — N182 Chronic kidney disease, stage 2 (mild): Secondary | ICD-10-CM | POA: Diagnosis not present

## 2019-02-11 DIAGNOSIS — I129 Hypertensive chronic kidney disease with stage 1 through stage 4 chronic kidney disease, or unspecified chronic kidney disease: Secondary | ICD-10-CM | POA: Diagnosis not present

## 2019-02-12 DIAGNOSIS — I129 Hypertensive chronic kidney disease with stage 1 through stage 4 chronic kidney disease, or unspecified chronic kidney disease: Secondary | ICD-10-CM | POA: Diagnosis not present

## 2019-02-12 DIAGNOSIS — N182 Chronic kidney disease, stage 2 (mild): Secondary | ICD-10-CM | POA: Diagnosis not present

## 2019-02-15 DIAGNOSIS — I129 Hypertensive chronic kidney disease with stage 1 through stage 4 chronic kidney disease, or unspecified chronic kidney disease: Secondary | ICD-10-CM | POA: Diagnosis not present

## 2019-02-15 DIAGNOSIS — N182 Chronic kidney disease, stage 2 (mild): Secondary | ICD-10-CM | POA: Diagnosis not present

## 2019-02-22 DIAGNOSIS — I129 Hypertensive chronic kidney disease with stage 1 through stage 4 chronic kidney disease, or unspecified chronic kidney disease: Secondary | ICD-10-CM | POA: Diagnosis not present

## 2019-02-22 DIAGNOSIS — R7303 Prediabetes: Secondary | ICD-10-CM | POA: Diagnosis not present

## 2019-02-22 DIAGNOSIS — N182 Chronic kidney disease, stage 2 (mild): Secondary | ICD-10-CM | POA: Diagnosis not present

## 2019-02-22 DIAGNOSIS — E785 Hyperlipidemia, unspecified: Secondary | ICD-10-CM | POA: Diagnosis not present

## 2019-02-24 DIAGNOSIS — I129 Hypertensive chronic kidney disease with stage 1 through stage 4 chronic kidney disease, or unspecified chronic kidney disease: Secondary | ICD-10-CM | POA: Diagnosis not present

## 2019-03-01 DIAGNOSIS — I129 Hypertensive chronic kidney disease with stage 1 through stage 4 chronic kidney disease, or unspecified chronic kidney disease: Secondary | ICD-10-CM | POA: Diagnosis not present

## 2019-03-01 DIAGNOSIS — N182 Chronic kidney disease, stage 2 (mild): Secondary | ICD-10-CM | POA: Diagnosis not present

## 2019-03-07 DIAGNOSIS — N182 Chronic kidney disease, stage 2 (mild): Secondary | ICD-10-CM | POA: Diagnosis not present

## 2019-03-07 DIAGNOSIS — Z6831 Body mass index (BMI) 31.0-31.9, adult: Secondary | ICD-10-CM | POA: Diagnosis not present

## 2019-03-07 DIAGNOSIS — I129 Hypertensive chronic kidney disease with stage 1 through stage 4 chronic kidney disease, or unspecified chronic kidney disease: Secondary | ICD-10-CM | POA: Diagnosis not present

## 2019-03-26 DIAGNOSIS — I129 Hypertensive chronic kidney disease with stage 1 through stage 4 chronic kidney disease, or unspecified chronic kidney disease: Secondary | ICD-10-CM | POA: Diagnosis not present

## 2019-03-26 DIAGNOSIS — Z6831 Body mass index (BMI) 31.0-31.9, adult: Secondary | ICD-10-CM | POA: Diagnosis not present

## 2019-03-26 DIAGNOSIS — N182 Chronic kidney disease, stage 2 (mild): Secondary | ICD-10-CM | POA: Diagnosis not present

## 2019-04-05 DIAGNOSIS — E348 Other specified endocrine disorders: Secondary | ICD-10-CM | POA: Diagnosis not present

## 2019-04-05 DIAGNOSIS — N182 Chronic kidney disease, stage 2 (mild): Secondary | ICD-10-CM | POA: Diagnosis not present

## 2019-04-05 DIAGNOSIS — I129 Hypertensive chronic kidney disease with stage 1 through stage 4 chronic kidney disease, or unspecified chronic kidney disease: Secondary | ICD-10-CM | POA: Diagnosis not present

## 2019-04-05 DIAGNOSIS — J449 Chronic obstructive pulmonary disease, unspecified: Secondary | ICD-10-CM | POA: Diagnosis not present

## 2019-04-26 DIAGNOSIS — N182 Chronic kidney disease, stage 2 (mild): Secondary | ICD-10-CM | POA: Diagnosis not present

## 2019-04-26 DIAGNOSIS — I129 Hypertensive chronic kidney disease with stage 1 through stage 4 chronic kidney disease, or unspecified chronic kidney disease: Secondary | ICD-10-CM | POA: Diagnosis not present

## 2019-04-26 DIAGNOSIS — E348 Other specified endocrine disorders: Secondary | ICD-10-CM | POA: Diagnosis not present

## 2019-04-26 DIAGNOSIS — J449 Chronic obstructive pulmonary disease, unspecified: Secondary | ICD-10-CM | POA: Diagnosis not present

## 2019-05-12 DIAGNOSIS — E785 Hyperlipidemia, unspecified: Secondary | ICD-10-CM | POA: Diagnosis not present

## 2019-05-12 DIAGNOSIS — I671 Cerebral aneurysm, nonruptured: Secondary | ICD-10-CM | POA: Diagnosis not present

## 2019-05-12 DIAGNOSIS — I4891 Unspecified atrial fibrillation: Secondary | ICD-10-CM | POA: Diagnosis not present

## 2019-05-12 DIAGNOSIS — I10 Essential (primary) hypertension: Secondary | ICD-10-CM | POA: Diagnosis not present

## 2019-05-12 DIAGNOSIS — I340 Nonrheumatic mitral (valve) insufficiency: Secondary | ICD-10-CM | POA: Diagnosis not present

## 2019-05-12 DIAGNOSIS — Z79899 Other long term (current) drug therapy: Secondary | ICD-10-CM | POA: Diagnosis not present

## 2019-05-12 DIAGNOSIS — J449 Chronic obstructive pulmonary disease, unspecified: Secondary | ICD-10-CM | POA: Diagnosis not present

## 2019-05-12 DIAGNOSIS — I361 Nonrheumatic tricuspid (valve) insufficiency: Secondary | ICD-10-CM | POA: Diagnosis not present

## 2019-05-12 DIAGNOSIS — R4701 Aphasia: Secondary | ICD-10-CM | POA: Diagnosis not present

## 2019-05-12 DIAGNOSIS — F329 Major depressive disorder, single episode, unspecified: Secondary | ICD-10-CM | POA: Diagnosis not present

## 2019-05-12 DIAGNOSIS — G459 Transient cerebral ischemic attack, unspecified: Secondary | ICD-10-CM | POA: Diagnosis not present

## 2019-05-13 DIAGNOSIS — F329 Major depressive disorder, single episode, unspecified: Secondary | ICD-10-CM | POA: Diagnosis not present

## 2019-05-13 DIAGNOSIS — G459 Transient cerebral ischemic attack, unspecified: Secondary | ICD-10-CM | POA: Diagnosis not present

## 2019-05-13 DIAGNOSIS — I4891 Unspecified atrial fibrillation: Secondary | ICD-10-CM | POA: Diagnosis not present

## 2019-05-13 DIAGNOSIS — R4182 Altered mental status, unspecified: Secondary | ICD-10-CM | POA: Diagnosis not present

## 2019-05-13 DIAGNOSIS — I10 Essential (primary) hypertension: Secondary | ICD-10-CM | POA: Diagnosis not present

## 2019-05-17 DIAGNOSIS — I129 Hypertensive chronic kidney disease with stage 1 through stage 4 chronic kidney disease, or unspecified chronic kidney disease: Secondary | ICD-10-CM | POA: Diagnosis not present

## 2019-05-17 DIAGNOSIS — I4891 Unspecified atrial fibrillation: Secondary | ICD-10-CM | POA: Diagnosis not present

## 2019-05-17 DIAGNOSIS — N182 Chronic kidney disease, stage 2 (mild): Secondary | ICD-10-CM | POA: Diagnosis not present

## 2019-05-17 DIAGNOSIS — Z7689 Persons encountering health services in other specified circumstances: Secondary | ICD-10-CM | POA: Diagnosis not present

## 2019-05-17 DIAGNOSIS — G459 Transient cerebral ischemic attack, unspecified: Secondary | ICD-10-CM | POA: Diagnosis not present

## 2019-05-27 DIAGNOSIS — I129 Hypertensive chronic kidney disease with stage 1 through stage 4 chronic kidney disease, or unspecified chronic kidney disease: Secondary | ICD-10-CM | POA: Diagnosis not present

## 2019-05-27 DIAGNOSIS — J449 Chronic obstructive pulmonary disease, unspecified: Secondary | ICD-10-CM | POA: Diagnosis not present

## 2019-05-27 DIAGNOSIS — N182 Chronic kidney disease, stage 2 (mild): Secondary | ICD-10-CM | POA: Diagnosis not present

## 2019-05-30 DIAGNOSIS — N182 Chronic kidney disease, stage 2 (mild): Secondary | ICD-10-CM | POA: Diagnosis not present

## 2019-05-30 DIAGNOSIS — I4891 Unspecified atrial fibrillation: Secondary | ICD-10-CM | POA: Diagnosis not present

## 2019-05-30 DIAGNOSIS — Z139 Encounter for screening, unspecified: Secondary | ICD-10-CM | POA: Diagnosis not present

## 2019-05-30 DIAGNOSIS — I129 Hypertensive chronic kidney disease with stage 1 through stage 4 chronic kidney disease, or unspecified chronic kidney disease: Secondary | ICD-10-CM | POA: Diagnosis not present

## 2019-05-30 DIAGNOSIS — R82998 Other abnormal findings in urine: Secondary | ICD-10-CM | POA: Diagnosis not present

## 2019-06-06 DIAGNOSIS — N390 Urinary tract infection, site not specified: Secondary | ICD-10-CM | POA: Diagnosis not present

## 2019-06-06 DIAGNOSIS — R509 Fever, unspecified: Secondary | ICD-10-CM | POA: Diagnosis not present

## 2019-06-06 DIAGNOSIS — Z209 Contact with and (suspected) exposure to unspecified communicable disease: Secondary | ICD-10-CM | POA: Diagnosis not present

## 2019-06-25 DIAGNOSIS — N182 Chronic kidney disease, stage 2 (mild): Secondary | ICD-10-CM | POA: Diagnosis not present

## 2019-06-25 DIAGNOSIS — I129 Hypertensive chronic kidney disease with stage 1 through stage 4 chronic kidney disease, or unspecified chronic kidney disease: Secondary | ICD-10-CM | POA: Diagnosis not present

## 2019-06-26 DIAGNOSIS — D1801 Hemangioma of skin and subcutaneous tissue: Secondary | ICD-10-CM | POA: Diagnosis not present

## 2019-06-26 DIAGNOSIS — D225 Melanocytic nevi of trunk: Secondary | ICD-10-CM | POA: Diagnosis not present

## 2019-06-26 DIAGNOSIS — N182 Chronic kidney disease, stage 2 (mild): Secondary | ICD-10-CM | POA: Diagnosis not present

## 2019-06-26 DIAGNOSIS — L821 Other seborrheic keratosis: Secondary | ICD-10-CM | POA: Diagnosis not present

## 2019-06-26 DIAGNOSIS — E2839 Other primary ovarian failure: Secondary | ICD-10-CM | POA: Diagnosis not present

## 2019-06-26 DIAGNOSIS — I4891 Unspecified atrial fibrillation: Secondary | ICD-10-CM | POA: Diagnosis not present

## 2019-06-26 DIAGNOSIS — D2239 Melanocytic nevi of other parts of face: Secondary | ICD-10-CM | POA: Diagnosis not present

## 2019-06-26 DIAGNOSIS — D485 Neoplasm of uncertain behavior of skin: Secondary | ICD-10-CM | POA: Diagnosis not present

## 2019-06-26 DIAGNOSIS — I129 Hypertensive chronic kidney disease with stage 1 through stage 4 chronic kidney disease, or unspecified chronic kidney disease: Secondary | ICD-10-CM | POA: Diagnosis not present

## 2019-06-26 DIAGNOSIS — M85852 Other specified disorders of bone density and structure, left thigh: Secondary | ICD-10-CM | POA: Diagnosis not present

## 2019-06-28 DIAGNOSIS — I129 Hypertensive chronic kidney disease with stage 1 through stage 4 chronic kidney disease, or unspecified chronic kidney disease: Secondary | ICD-10-CM | POA: Diagnosis not present

## 2019-06-28 DIAGNOSIS — Z23 Encounter for immunization: Secondary | ICD-10-CM | POA: Diagnosis not present

## 2019-06-28 DIAGNOSIS — N182 Chronic kidney disease, stage 2 (mild): Secondary | ICD-10-CM | POA: Diagnosis not present

## 2019-06-28 DIAGNOSIS — I4891 Unspecified atrial fibrillation: Secondary | ICD-10-CM | POA: Diagnosis not present

## 2019-07-16 DIAGNOSIS — Z20828 Contact with and (suspected) exposure to other viral communicable diseases: Secondary | ICD-10-CM | POA: Diagnosis not present

## 2019-07-25 DIAGNOSIS — I129 Hypertensive chronic kidney disease with stage 1 through stage 4 chronic kidney disease, or unspecified chronic kidney disease: Secondary | ICD-10-CM | POA: Diagnosis not present

## 2019-07-25 DIAGNOSIS — N182 Chronic kidney disease, stage 2 (mild): Secondary | ICD-10-CM | POA: Diagnosis not present

## 2019-07-26 DIAGNOSIS — N182 Chronic kidney disease, stage 2 (mild): Secondary | ICD-10-CM | POA: Diagnosis not present

## 2019-07-26 DIAGNOSIS — I4891 Unspecified atrial fibrillation: Secondary | ICD-10-CM | POA: Diagnosis not present

## 2019-07-26 DIAGNOSIS — I129 Hypertensive chronic kidney disease with stage 1 through stage 4 chronic kidney disease, or unspecified chronic kidney disease: Secondary | ICD-10-CM | POA: Diagnosis not present

## 2019-07-29 DIAGNOSIS — E785 Hyperlipidemia, unspecified: Secondary | ICD-10-CM | POA: Diagnosis not present

## 2019-07-29 DIAGNOSIS — R7303 Prediabetes: Secondary | ICD-10-CM | POA: Diagnosis not present

## 2019-08-12 DIAGNOSIS — R7303 Prediabetes: Secondary | ICD-10-CM | POA: Diagnosis not present

## 2019-08-12 DIAGNOSIS — I129 Hypertensive chronic kidney disease with stage 1 through stage 4 chronic kidney disease, or unspecified chronic kidney disease: Secondary | ICD-10-CM | POA: Diagnosis not present

## 2019-08-12 DIAGNOSIS — N182 Chronic kidney disease, stage 2 (mild): Secondary | ICD-10-CM | POA: Diagnosis not present

## 2019-08-12 DIAGNOSIS — E785 Hyperlipidemia, unspecified: Secondary | ICD-10-CM | POA: Diagnosis not present

## 2019-08-25 DIAGNOSIS — R7303 Prediabetes: Secondary | ICD-10-CM | POA: Diagnosis not present

## 2019-08-25 DIAGNOSIS — N182 Chronic kidney disease, stage 2 (mild): Secondary | ICD-10-CM | POA: Diagnosis not present

## 2019-08-25 DIAGNOSIS — I129 Hypertensive chronic kidney disease with stage 1 through stage 4 chronic kidney disease, or unspecified chronic kidney disease: Secondary | ICD-10-CM | POA: Diagnosis not present

## 2019-08-25 DIAGNOSIS — E785 Hyperlipidemia, unspecified: Secondary | ICD-10-CM | POA: Diagnosis not present

## 2019-08-26 DIAGNOSIS — I129 Hypertensive chronic kidney disease with stage 1 through stage 4 chronic kidney disease, or unspecified chronic kidney disease: Secondary | ICD-10-CM | POA: Diagnosis not present

## 2019-09-06 DIAGNOSIS — G4730 Sleep apnea, unspecified: Secondary | ICD-10-CM | POA: Diagnosis not present

## 2019-09-10 DIAGNOSIS — I129 Hypertensive chronic kidney disease with stage 1 through stage 4 chronic kidney disease, or unspecified chronic kidney disease: Secondary | ICD-10-CM | POA: Diagnosis not present

## 2019-09-10 DIAGNOSIS — N182 Chronic kidney disease, stage 2 (mild): Secondary | ICD-10-CM | POA: Diagnosis not present

## 2019-09-10 DIAGNOSIS — Z6828 Body mass index (BMI) 28.0-28.9, adult: Secondary | ICD-10-CM | POA: Diagnosis not present

## 2019-09-25 DIAGNOSIS — N182 Chronic kidney disease, stage 2 (mild): Secondary | ICD-10-CM | POA: Diagnosis not present

## 2019-09-25 DIAGNOSIS — E785 Hyperlipidemia, unspecified: Secondary | ICD-10-CM | POA: Diagnosis not present

## 2019-09-25 DIAGNOSIS — I129 Hypertensive chronic kidney disease with stage 1 through stage 4 chronic kidney disease, or unspecified chronic kidney disease: Secondary | ICD-10-CM | POA: Diagnosis not present

## 2019-09-26 DIAGNOSIS — I129 Hypertensive chronic kidney disease with stage 1 through stage 4 chronic kidney disease, or unspecified chronic kidney disease: Secondary | ICD-10-CM | POA: Diagnosis not present

## 2019-10-11 DIAGNOSIS — J449 Chronic obstructive pulmonary disease, unspecified: Secondary | ICD-10-CM | POA: Diagnosis not present

## 2019-10-11 DIAGNOSIS — N182 Chronic kidney disease, stage 2 (mild): Secondary | ICD-10-CM | POA: Diagnosis not present

## 2019-10-11 DIAGNOSIS — I129 Hypertensive chronic kidney disease with stage 1 through stage 4 chronic kidney disease, or unspecified chronic kidney disease: Secondary | ICD-10-CM | POA: Diagnosis not present

## 2019-10-11 DIAGNOSIS — G4733 Obstructive sleep apnea (adult) (pediatric): Secondary | ICD-10-CM | POA: Diagnosis not present

## 2019-10-24 DIAGNOSIS — I129 Hypertensive chronic kidney disease with stage 1 through stage 4 chronic kidney disease, or unspecified chronic kidney disease: Secondary | ICD-10-CM | POA: Diagnosis not present

## 2019-10-25 DIAGNOSIS — G4733 Obstructive sleep apnea (adult) (pediatric): Secondary | ICD-10-CM | POA: Diagnosis not present

## 2019-10-25 DIAGNOSIS — J449 Chronic obstructive pulmonary disease, unspecified: Secondary | ICD-10-CM | POA: Diagnosis not present

## 2019-10-25 DIAGNOSIS — N182 Chronic kidney disease, stage 2 (mild): Secondary | ICD-10-CM | POA: Diagnosis not present

## 2019-10-25 DIAGNOSIS — I129 Hypertensive chronic kidney disease with stage 1 through stage 4 chronic kidney disease, or unspecified chronic kidney disease: Secondary | ICD-10-CM | POA: Diagnosis not present

## 2019-11-08 DIAGNOSIS — Z0000 Encounter for general adult medical examination without abnormal findings: Secondary | ICD-10-CM | POA: Diagnosis not present

## 2019-11-08 DIAGNOSIS — Z1331 Encounter for screening for depression: Secondary | ICD-10-CM | POA: Diagnosis not present

## 2019-11-08 DIAGNOSIS — Z139 Encounter for screening, unspecified: Secondary | ICD-10-CM | POA: Diagnosis not present

## 2019-11-08 DIAGNOSIS — Z136 Encounter for screening for cardiovascular disorders: Secondary | ICD-10-CM | POA: Diagnosis not present

## 2019-11-08 DIAGNOSIS — Z1339 Encounter for screening examination for other mental health and behavioral disorders: Secondary | ICD-10-CM | POA: Diagnosis not present

## 2019-11-08 DIAGNOSIS — Z7189 Other specified counseling: Secondary | ICD-10-CM | POA: Diagnosis not present

## 2019-11-23 DIAGNOSIS — I129 Hypertensive chronic kidney disease with stage 1 through stage 4 chronic kidney disease, or unspecified chronic kidney disease: Secondary | ICD-10-CM | POA: Diagnosis not present

## 2019-11-24 DIAGNOSIS — N182 Chronic kidney disease, stage 2 (mild): Secondary | ICD-10-CM | POA: Diagnosis not present

## 2019-11-24 DIAGNOSIS — I4891 Unspecified atrial fibrillation: Secondary | ICD-10-CM | POA: Diagnosis not present

## 2019-11-24 DIAGNOSIS — I129 Hypertensive chronic kidney disease with stage 1 through stage 4 chronic kidney disease, or unspecified chronic kidney disease: Secondary | ICD-10-CM | POA: Diagnosis not present

## 2019-11-24 DIAGNOSIS — G4733 Obstructive sleep apnea (adult) (pediatric): Secondary | ICD-10-CM | POA: Diagnosis not present

## 2019-11-28 DIAGNOSIS — H04123 Dry eye syndrome of bilateral lacrimal glands: Secondary | ICD-10-CM | POA: Diagnosis not present

## 2019-11-28 DIAGNOSIS — H2703 Aphakia, bilateral: Secondary | ICD-10-CM | POA: Diagnosis not present

## 2019-12-09 DIAGNOSIS — Z0000 Encounter for general adult medical examination without abnormal findings: Secondary | ICD-10-CM | POA: Diagnosis not present

## 2019-12-09 DIAGNOSIS — Z1211 Encounter for screening for malignant neoplasm of colon: Secondary | ICD-10-CM | POA: Diagnosis not present

## 2019-12-09 DIAGNOSIS — Z6827 Body mass index (BMI) 27.0-27.9, adult: Secondary | ICD-10-CM | POA: Diagnosis not present

## 2019-12-09 DIAGNOSIS — Z139 Encounter for screening, unspecified: Secondary | ICD-10-CM | POA: Diagnosis not present

## 2019-12-23 DIAGNOSIS — R7303 Prediabetes: Secondary | ICD-10-CM | POA: Diagnosis not present

## 2019-12-23 DIAGNOSIS — E785 Hyperlipidemia, unspecified: Secondary | ICD-10-CM | POA: Diagnosis not present

## 2019-12-23 DIAGNOSIS — I129 Hypertensive chronic kidney disease with stage 1 through stage 4 chronic kidney disease, or unspecified chronic kidney disease: Secondary | ICD-10-CM | POA: Diagnosis not present

## 2019-12-24 DIAGNOSIS — I129 Hypertensive chronic kidney disease with stage 1 through stage 4 chronic kidney disease, or unspecified chronic kidney disease: Secondary | ICD-10-CM | POA: Diagnosis not present

## 2019-12-25 DIAGNOSIS — I129 Hypertensive chronic kidney disease with stage 1 through stage 4 chronic kidney disease, or unspecified chronic kidney disease: Secondary | ICD-10-CM | POA: Diagnosis not present

## 2019-12-25 DIAGNOSIS — N182 Chronic kidney disease, stage 2 (mild): Secondary | ICD-10-CM | POA: Diagnosis not present

## 2019-12-25 DIAGNOSIS — I4891 Unspecified atrial fibrillation: Secondary | ICD-10-CM | POA: Diagnosis not present

## 2019-12-25 DIAGNOSIS — G4733 Obstructive sleep apnea (adult) (pediatric): Secondary | ICD-10-CM | POA: Diagnosis not present

## 2020-01-02 DIAGNOSIS — E785 Hyperlipidemia, unspecified: Secondary | ICD-10-CM | POA: Diagnosis not present

## 2020-01-02 DIAGNOSIS — N182 Chronic kidney disease, stage 2 (mild): Secondary | ICD-10-CM | POA: Diagnosis not present

## 2020-01-02 DIAGNOSIS — J449 Chronic obstructive pulmonary disease, unspecified: Secondary | ICD-10-CM | POA: Diagnosis not present

## 2020-01-02 DIAGNOSIS — I129 Hypertensive chronic kidney disease with stage 1 through stage 4 chronic kidney disease, or unspecified chronic kidney disease: Secondary | ICD-10-CM | POA: Diagnosis not present

## 2020-01-07 DIAGNOSIS — Z1231 Encounter for screening mammogram for malignant neoplasm of breast: Secondary | ICD-10-CM | POA: Diagnosis not present

## 2020-01-13 DIAGNOSIS — Z6827 Body mass index (BMI) 27.0-27.9, adult: Secondary | ICD-10-CM | POA: Diagnosis not present

## 2020-01-13 DIAGNOSIS — H6120 Impacted cerumen, unspecified ear: Secondary | ICD-10-CM | POA: Diagnosis not present

## 2020-01-13 DIAGNOSIS — H6012 Cellulitis of left external ear: Secondary | ICD-10-CM | POA: Diagnosis not present

## 2020-01-17 DIAGNOSIS — I129 Hypertensive chronic kidney disease with stage 1 through stage 4 chronic kidney disease, or unspecified chronic kidney disease: Secondary | ICD-10-CM | POA: Diagnosis not present

## 2020-01-17 DIAGNOSIS — Z6827 Body mass index (BMI) 27.0-27.9, adult: Secondary | ICD-10-CM | POA: Diagnosis not present

## 2020-01-17 DIAGNOSIS — N182 Chronic kidney disease, stage 2 (mild): Secondary | ICD-10-CM | POA: Diagnosis not present

## 2020-01-23 DIAGNOSIS — I129 Hypertensive chronic kidney disease with stage 1 through stage 4 chronic kidney disease, or unspecified chronic kidney disease: Secondary | ICD-10-CM | POA: Diagnosis not present

## 2020-01-24 DIAGNOSIS — J449 Chronic obstructive pulmonary disease, unspecified: Secondary | ICD-10-CM | POA: Diagnosis not present

## 2020-01-24 DIAGNOSIS — E785 Hyperlipidemia, unspecified: Secondary | ICD-10-CM | POA: Diagnosis not present

## 2020-01-24 DIAGNOSIS — N182 Chronic kidney disease, stage 2 (mild): Secondary | ICD-10-CM | POA: Diagnosis not present

## 2020-01-24 DIAGNOSIS — I129 Hypertensive chronic kidney disease with stage 1 through stage 4 chronic kidney disease, or unspecified chronic kidney disease: Secondary | ICD-10-CM | POA: Diagnosis not present

## 2020-02-21 DIAGNOSIS — I129 Hypertensive chronic kidney disease with stage 1 through stage 4 chronic kidney disease, or unspecified chronic kidney disease: Secondary | ICD-10-CM | POA: Diagnosis not present

## 2020-02-23 DIAGNOSIS — I129 Hypertensive chronic kidney disease with stage 1 through stage 4 chronic kidney disease, or unspecified chronic kidney disease: Secondary | ICD-10-CM | POA: Diagnosis not present

## 2020-02-24 DIAGNOSIS — I129 Hypertensive chronic kidney disease with stage 1 through stage 4 chronic kidney disease, or unspecified chronic kidney disease: Secondary | ICD-10-CM | POA: Diagnosis not present

## 2020-02-24 DIAGNOSIS — J449 Chronic obstructive pulmonary disease, unspecified: Secondary | ICD-10-CM | POA: Diagnosis not present

## 2020-02-24 DIAGNOSIS — N182 Chronic kidney disease, stage 2 (mild): Secondary | ICD-10-CM | POA: Diagnosis not present

## 2020-02-24 DIAGNOSIS — E785 Hyperlipidemia, unspecified: Secondary | ICD-10-CM | POA: Diagnosis not present

## 2020-03-24 DIAGNOSIS — I129 Hypertensive chronic kidney disease with stage 1 through stage 4 chronic kidney disease, or unspecified chronic kidney disease: Secondary | ICD-10-CM | POA: Diagnosis not present

## 2020-03-25 DIAGNOSIS — I129 Hypertensive chronic kidney disease with stage 1 through stage 4 chronic kidney disease, or unspecified chronic kidney disease: Secondary | ICD-10-CM | POA: Diagnosis not present

## 2020-03-25 DIAGNOSIS — J449 Chronic obstructive pulmonary disease, unspecified: Secondary | ICD-10-CM | POA: Diagnosis not present

## 2020-03-25 DIAGNOSIS — N182 Chronic kidney disease, stage 2 (mild): Secondary | ICD-10-CM | POA: Diagnosis not present

## 2020-03-25 DIAGNOSIS — E785 Hyperlipidemia, unspecified: Secondary | ICD-10-CM | POA: Diagnosis not present

## 2020-04-25 DIAGNOSIS — N182 Chronic kidney disease, stage 2 (mild): Secondary | ICD-10-CM | POA: Diagnosis not present

## 2020-04-25 DIAGNOSIS — I129 Hypertensive chronic kidney disease with stage 1 through stage 4 chronic kidney disease, or unspecified chronic kidney disease: Secondary | ICD-10-CM | POA: Diagnosis not present

## 2020-04-26 DIAGNOSIS — J449 Chronic obstructive pulmonary disease, unspecified: Secondary | ICD-10-CM | POA: Diagnosis not present

## 2020-04-26 DIAGNOSIS — N182 Chronic kidney disease, stage 2 (mild): Secondary | ICD-10-CM | POA: Diagnosis not present

## 2020-04-26 DIAGNOSIS — E785 Hyperlipidemia, unspecified: Secondary | ICD-10-CM | POA: Diagnosis not present

## 2020-04-26 DIAGNOSIS — I129 Hypertensive chronic kidney disease with stage 1 through stage 4 chronic kidney disease, or unspecified chronic kidney disease: Secondary | ICD-10-CM | POA: Diagnosis not present

## 2020-04-29 DIAGNOSIS — E785 Hyperlipidemia, unspecified: Secondary | ICD-10-CM | POA: Diagnosis not present

## 2020-04-29 DIAGNOSIS — R7303 Prediabetes: Secondary | ICD-10-CM | POA: Diagnosis not present

## 2020-05-07 DIAGNOSIS — E876 Hypokalemia: Secondary | ICD-10-CM | POA: Diagnosis not present

## 2020-05-07 DIAGNOSIS — H6980 Other specified disorders of Eustachian tube, unspecified ear: Secondary | ICD-10-CM | POA: Diagnosis not present

## 2020-05-07 DIAGNOSIS — N182 Chronic kidney disease, stage 2 (mild): Secondary | ICD-10-CM | POA: Diagnosis not present

## 2020-05-07 DIAGNOSIS — I129 Hypertensive chronic kidney disease with stage 1 through stage 4 chronic kidney disease, or unspecified chronic kidney disease: Secondary | ICD-10-CM | POA: Diagnosis not present

## 2020-05-18 DIAGNOSIS — M25552 Pain in left hip: Secondary | ICD-10-CM | POA: Diagnosis not present

## 2020-05-18 DIAGNOSIS — M25559 Pain in unspecified hip: Secondary | ICD-10-CM | POA: Diagnosis not present

## 2020-05-18 DIAGNOSIS — M25752 Osteophyte, left hip: Secondary | ICD-10-CM | POA: Diagnosis not present

## 2020-05-18 DIAGNOSIS — Z6828 Body mass index (BMI) 28.0-28.9, adult: Secondary | ICD-10-CM | POA: Diagnosis not present

## 2020-05-26 DIAGNOSIS — J449 Chronic obstructive pulmonary disease, unspecified: Secondary | ICD-10-CM | POA: Diagnosis not present

## 2020-05-26 DIAGNOSIS — N182 Chronic kidney disease, stage 2 (mild): Secondary | ICD-10-CM | POA: Diagnosis not present

## 2020-05-26 DIAGNOSIS — E785 Hyperlipidemia, unspecified: Secondary | ICD-10-CM | POA: Diagnosis not present

## 2020-05-26 DIAGNOSIS — Z6828 Body mass index (BMI) 28.0-28.9, adult: Secondary | ICD-10-CM | POA: Diagnosis not present

## 2020-05-26 DIAGNOSIS — I129 Hypertensive chronic kidney disease with stage 1 through stage 4 chronic kidney disease, or unspecified chronic kidney disease: Secondary | ICD-10-CM | POA: Diagnosis not present

## 2020-05-26 DIAGNOSIS — M25559 Pain in unspecified hip: Secondary | ICD-10-CM | POA: Diagnosis not present

## 2020-05-27 DIAGNOSIS — E785 Hyperlipidemia, unspecified: Secondary | ICD-10-CM | POA: Diagnosis not present

## 2020-05-27 DIAGNOSIS — J449 Chronic obstructive pulmonary disease, unspecified: Secondary | ICD-10-CM | POA: Diagnosis not present

## 2020-05-27 DIAGNOSIS — I129 Hypertensive chronic kidney disease with stage 1 through stage 4 chronic kidney disease, or unspecified chronic kidney disease: Secondary | ICD-10-CM | POA: Diagnosis not present

## 2020-05-27 DIAGNOSIS — N182 Chronic kidney disease, stage 2 (mild): Secondary | ICD-10-CM | POA: Diagnosis not present

## 2020-05-28 DIAGNOSIS — M7060 Trochanteric bursitis, unspecified hip: Secondary | ICD-10-CM | POA: Diagnosis not present

## 2020-06-23 DIAGNOSIS — J329 Chronic sinusitis, unspecified: Secondary | ICD-10-CM | POA: Diagnosis not present

## 2020-06-23 DIAGNOSIS — R05 Cough: Secondary | ICD-10-CM | POA: Diagnosis not present

## 2020-06-23 DIAGNOSIS — J029 Acute pharyngitis, unspecified: Secondary | ICD-10-CM | POA: Diagnosis not present

## 2020-06-23 DIAGNOSIS — J449 Chronic obstructive pulmonary disease, unspecified: Secondary | ICD-10-CM | POA: Diagnosis not present

## 2020-06-23 DIAGNOSIS — R0602 Shortness of breath: Secondary | ICD-10-CM | POA: Diagnosis not present

## 2020-06-26 DIAGNOSIS — N182 Chronic kidney disease, stage 2 (mild): Secondary | ICD-10-CM | POA: Diagnosis not present

## 2020-06-26 DIAGNOSIS — J449 Chronic obstructive pulmonary disease, unspecified: Secondary | ICD-10-CM | POA: Diagnosis not present

## 2020-06-26 DIAGNOSIS — I129 Hypertensive chronic kidney disease with stage 1 through stage 4 chronic kidney disease, or unspecified chronic kidney disease: Secondary | ICD-10-CM | POA: Diagnosis not present

## 2020-06-26 DIAGNOSIS — E785 Hyperlipidemia, unspecified: Secondary | ICD-10-CM | POA: Diagnosis not present

## 2020-07-27 DIAGNOSIS — N182 Chronic kidney disease, stage 2 (mild): Secondary | ICD-10-CM | POA: Diagnosis not present

## 2020-07-27 DIAGNOSIS — I129 Hypertensive chronic kidney disease with stage 1 through stage 4 chronic kidney disease, or unspecified chronic kidney disease: Secondary | ICD-10-CM | POA: Diagnosis not present

## 2020-07-27 DIAGNOSIS — J449 Chronic obstructive pulmonary disease, unspecified: Secondary | ICD-10-CM | POA: Diagnosis not present

## 2020-07-27 DIAGNOSIS — E785 Hyperlipidemia, unspecified: Secondary | ICD-10-CM | POA: Diagnosis not present

## 2020-08-14 DIAGNOSIS — L820 Inflamed seborrheic keratosis: Secondary | ICD-10-CM | POA: Diagnosis not present

## 2020-08-14 DIAGNOSIS — D485 Neoplasm of uncertain behavior of skin: Secondary | ICD-10-CM | POA: Diagnosis not present

## 2020-08-14 DIAGNOSIS — D1801 Hemangioma of skin and subcutaneous tissue: Secondary | ICD-10-CM | POA: Diagnosis not present

## 2020-08-14 DIAGNOSIS — D225 Melanocytic nevi of trunk: Secondary | ICD-10-CM | POA: Diagnosis not present

## 2020-08-14 DIAGNOSIS — L578 Other skin changes due to chronic exposure to nonionizing radiation: Secondary | ICD-10-CM | POA: Diagnosis not present

## 2020-08-18 DIAGNOSIS — Z823 Family history of stroke: Secondary | ICD-10-CM | POA: Diagnosis not present

## 2020-08-18 DIAGNOSIS — Z809 Family history of malignant neoplasm, unspecified: Secondary | ICD-10-CM | POA: Diagnosis not present

## 2020-08-18 DIAGNOSIS — Z7901 Long term (current) use of anticoagulants: Secondary | ICD-10-CM | POA: Diagnosis not present

## 2020-08-18 DIAGNOSIS — I10 Essential (primary) hypertension: Secondary | ICD-10-CM | POA: Diagnosis not present

## 2020-08-18 DIAGNOSIS — Z8249 Family history of ischemic heart disease and other diseases of the circulatory system: Secondary | ICD-10-CM | POA: Diagnosis not present

## 2020-08-18 DIAGNOSIS — E785 Hyperlipidemia, unspecified: Secondary | ICD-10-CM | POA: Diagnosis not present

## 2020-08-18 DIAGNOSIS — D6869 Other thrombophilia: Secondary | ICD-10-CM | POA: Diagnosis not present

## 2020-08-18 DIAGNOSIS — I4891 Unspecified atrial fibrillation: Secondary | ICD-10-CM | POA: Diagnosis not present

## 2020-08-18 DIAGNOSIS — Z008 Encounter for other general examination: Secondary | ICD-10-CM | POA: Diagnosis not present

## 2020-08-18 DIAGNOSIS — J449 Chronic obstructive pulmonary disease, unspecified: Secondary | ICD-10-CM | POA: Diagnosis not present

## 2020-08-18 DIAGNOSIS — R69 Illness, unspecified: Secondary | ICD-10-CM | POA: Diagnosis not present

## 2020-08-25 DIAGNOSIS — Z6828 Body mass index (BMI) 28.0-28.9, adult: Secondary | ICD-10-CM | POA: Diagnosis not present

## 2020-08-25 DIAGNOSIS — R300 Dysuria: Secondary | ICD-10-CM | POA: Diagnosis not present

## 2020-08-25 DIAGNOSIS — S39012A Strain of muscle, fascia and tendon of lower back, initial encounter: Secondary | ICD-10-CM | POA: Diagnosis not present

## 2020-08-26 DIAGNOSIS — I129 Hypertensive chronic kidney disease with stage 1 through stage 4 chronic kidney disease, or unspecified chronic kidney disease: Secondary | ICD-10-CM | POA: Diagnosis not present

## 2020-08-26 DIAGNOSIS — N182 Chronic kidney disease, stage 2 (mild): Secondary | ICD-10-CM | POA: Diagnosis not present

## 2020-08-26 DIAGNOSIS — E785 Hyperlipidemia, unspecified: Secondary | ICD-10-CM | POA: Diagnosis not present

## 2020-08-26 DIAGNOSIS — J449 Chronic obstructive pulmonary disease, unspecified: Secondary | ICD-10-CM | POA: Diagnosis not present

## 2020-09-08 DIAGNOSIS — R55 Syncope and collapse: Secondary | ICD-10-CM | POA: Diagnosis not present

## 2020-09-08 DIAGNOSIS — E785 Hyperlipidemia, unspecified: Secondary | ICD-10-CM | POA: Diagnosis not present

## 2020-09-08 DIAGNOSIS — D649 Anemia, unspecified: Secondary | ICD-10-CM | POA: Diagnosis not present

## 2020-09-08 DIAGNOSIS — R42 Dizziness and giddiness: Secondary | ICD-10-CM | POA: Diagnosis not present

## 2020-09-08 DIAGNOSIS — R5383 Other fatigue: Secondary | ICD-10-CM | POA: Diagnosis not present

## 2020-09-08 DIAGNOSIS — I4891 Unspecified atrial fibrillation: Secondary | ICD-10-CM | POA: Diagnosis not present

## 2020-09-08 DIAGNOSIS — R7303 Prediabetes: Secondary | ICD-10-CM | POA: Diagnosis not present

## 2020-09-08 DIAGNOSIS — Z6827 Body mass index (BMI) 27.0-27.9, adult: Secondary | ICD-10-CM | POA: Diagnosis not present

## 2020-09-11 DIAGNOSIS — R42 Dizziness and giddiness: Secondary | ICD-10-CM | POA: Diagnosis not present

## 2020-09-11 DIAGNOSIS — R55 Syncope and collapse: Secondary | ICD-10-CM | POA: Diagnosis not present

## 2020-09-14 DIAGNOSIS — E876 Hypokalemia: Secondary | ICD-10-CM | POA: Diagnosis not present

## 2020-09-26 DIAGNOSIS — E785 Hyperlipidemia, unspecified: Secondary | ICD-10-CM | POA: Diagnosis not present

## 2020-09-26 DIAGNOSIS — I129 Hypertensive chronic kidney disease with stage 1 through stage 4 chronic kidney disease, or unspecified chronic kidney disease: Secondary | ICD-10-CM | POA: Diagnosis not present

## 2020-09-26 DIAGNOSIS — N182 Chronic kidney disease, stage 2 (mild): Secondary | ICD-10-CM | POA: Diagnosis not present

## 2020-09-26 DIAGNOSIS — J449 Chronic obstructive pulmonary disease, unspecified: Secondary | ICD-10-CM | POA: Diagnosis not present

## 2020-09-30 DIAGNOSIS — C44622 Squamous cell carcinoma of skin of right upper limb, including shoulder: Secondary | ICD-10-CM | POA: Diagnosis not present

## 2020-10-05 DIAGNOSIS — J449 Chronic obstructive pulmonary disease, unspecified: Secondary | ICD-10-CM | POA: Diagnosis not present

## 2020-10-05 DIAGNOSIS — E785 Hyperlipidemia, unspecified: Secondary | ICD-10-CM | POA: Diagnosis not present

## 2020-10-05 DIAGNOSIS — I129 Hypertensive chronic kidney disease with stage 1 through stage 4 chronic kidney disease, or unspecified chronic kidney disease: Secondary | ICD-10-CM | POA: Diagnosis not present

## 2020-10-05 DIAGNOSIS — R7303 Prediabetes: Secondary | ICD-10-CM | POA: Diagnosis not present

## 2020-10-05 DIAGNOSIS — N182 Chronic kidney disease, stage 2 (mild): Secondary | ICD-10-CM | POA: Diagnosis not present

## 2020-10-07 DIAGNOSIS — J449 Chronic obstructive pulmonary disease, unspecified: Secondary | ICD-10-CM | POA: Insufficient documentation

## 2020-10-07 DIAGNOSIS — G8929 Other chronic pain: Secondary | ICD-10-CM | POA: Insufficient documentation

## 2020-10-07 DIAGNOSIS — R519 Headache, unspecified: Secondary | ICD-10-CM | POA: Insufficient documentation

## 2020-10-07 DIAGNOSIS — E785 Hyperlipidemia, unspecified: Secondary | ICD-10-CM | POA: Insufficient documentation

## 2020-10-07 DIAGNOSIS — I10 Essential (primary) hypertension: Secondary | ICD-10-CM | POA: Insufficient documentation

## 2020-10-09 DIAGNOSIS — Z6827 Body mass index (BMI) 27.0-27.9, adult: Secondary | ICD-10-CM | POA: Diagnosis not present

## 2020-10-09 DIAGNOSIS — C44622 Squamous cell carcinoma of skin of right upper limb, including shoulder: Secondary | ICD-10-CM | POA: Diagnosis not present

## 2020-10-09 DIAGNOSIS — R9431 Abnormal electrocardiogram [ECG] [EKG]: Secondary | ICD-10-CM | POA: Diagnosis not present

## 2020-10-09 DIAGNOSIS — R079 Chest pain, unspecified: Secondary | ICD-10-CM | POA: Diagnosis not present

## 2020-10-09 DIAGNOSIS — I959 Hypotension, unspecified: Secondary | ICD-10-CM | POA: Diagnosis not present

## 2020-10-13 DIAGNOSIS — I4891 Unspecified atrial fibrillation: Secondary | ICD-10-CM | POA: Insufficient documentation

## 2020-10-13 NOTE — Progress Notes (Signed)
Cardiology Office Note:    Date:  10/14/2020   ID:  Abigail Foster, DOB 06-Jan-1946, MRN [id number]  PCP:  Marco Collie, MD  Cardiologist:  Shirlee More, MD   Referring MD: Marco Collie, MD  ASSESSMENT:    1. Near syncope   2. Longstanding persistent atrial fibrillation ([place])   3. Primary hypertension   4. Mixed hyperlipidemia    PLAN:    In order of problems listed above:  1. The mechanism here is hypotension she has no bradycardia or pauses on her monitor I will dial back on her blood pressure medications and I will have her take her ARB only for systolics exceeding 409 and continue carvedilol. 2. Stable rate is controlled continue her beta-blocker and anticoagulant 3. Continue beta-blocker she will take her ARB thiazide only as needed 4. Stable hyperlipidemia continue statin  Next appointment 6 follow-up she will follow-up in 6 to 8 weeks and trend blood pressure at home and bring those measurements to the office with her   Medication Adjustments/Labs and Tests Ordered: Current medicines are reviewed at length with the patient today.  Concerns regarding medicines are outlined above.  Orders Placed This Encounter  Procedures  . EKG 12-Lead   No orders of the defined types were placed in this encounter.    Chief Complaint  Patient presents with  . Near Syncope  I wore a heart rhythm monitor and was told to see cardiology  History of Present Illness:    Abigail Foster is a 75 y.o. female with a history of atrial fibrillation who is being seen today for the evaluation of lightheadedness/near syncope at the request of Marco Collie, MD.  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  Chart review, CTA chest without contrast 2014 with mild thoracic aortic atherosclerosis Chest x-ray [facility] 06/23/2020 read as normal no active cardiopulmonary disease Echocardiogram  [facility] 05/11/2020 with atrial fibrillation ejection fraction normal to low normal 50 to 55% mild left atrial enlargement mild mitral regurgitation contrast echo was performed without evidence of shunt. CT angiogram head and neck performed the same date by Dr. Graylon Good in the emergency room showed no large vessel occlusion and no atheroma sclerosis. EKG [facility] 05/12/2019 shows atrial fibrillation controlled ventricular rate low voltage QRS nonspecific T waves EKG [facility] 08/15/2009 showed sinus bradycardia 46 bpm nonspecific T wave abnormality.  At that time she was evaluated for syncope and near syncope.  She relates that she wore a monitor recently over a week and was told it was abnormal and referred to my office will try to access that report. She has a history of variable blood pressure and has been off and on antihypertensive agents presently she is taking carvedilol as needed amlodipine and an ARB.  She rarely checks her blood pressure at home.  She has not had any episodes of syncope or near syncope in the last few weeks. She relates that she has had no bleeding with her anticoagulant.  Recent labs 09/08/2020 cholesterol 147 LDL 122 triglycerides 79 HDL 45 A1c 5.5%.  I received her monitor report by fax.  Duration 4 days she had no bradycardic events and most of the time when symptomatic her heart rates are in the range of 90 to 100 bpm.  There are times of heart rates in the 130s to 150s however in atrial fibrillation is difficult to judge  heart rate control her resting heart rate is at target at this time I continue her current beta-blocker.  She has persistent hypotension in my office and I think that is the mechanism of her dizziness near syncope she will discontinue her ARB and take it only if her morning blood pressure exceeds 150. Past Medical History:  Diagnosis Date  . Aneurysm ([place]) 2000  . Asthma, chronic 06/27/2013   Followed in Pulmonary clinic/ [facility]  - PFT's 06/27/2013  FEV1 1.68 (69%) ratio 66 and 12% better p B2 and DLCO 71 corrects to 81  - hfa 50% p coaching 06/27/2013    . Chronic headache   . Chronic obstructive airway disease ([place])   . Dyspnea 05/19/2013   Followed in Pulmonary clinic/ [facility]/ [facility]    . HBP (high blood pressure) 06/30/2013   D/c coreg 05/20/13 > better 06/27/13 with reversible airflow obst on pfts    . HTN (hypertension)   . Hyperlipidemia   . Solitary pulmonary nodule 05/28/2013   CT chest 04/26/13 :   43mm LUL nodule, never smoker so 12 month f/u rec and entered into tickle file for recall 04/29/14> refused to schedule/ Dr Nyra Capes notified     Past Surgical History:  Procedure Laterality Date  . CATARACT EXTRACTION    . CHOLECYSTECTOMY  2001  . TOTAL ABDOMINAL HYSTERECTOMY  1983    Current Medications: Current Meds  Medication Sig  . albuterol (VENTOLIN HFA) 108 (90 Base) MCG/ACT inhaler Inhale 1 puff into the lungs every 4 (four) hours as needed.  . carvedilol (COREG) 12.5 MG tablet Take 12.5 mg by mouth 2 (two) times daily with a meal.  . cholecalciferol (VITAMIN D) 1000 UNITS tablet Take 1,000 Units by mouth daily.  . citalopram (CELEXA) 20 MG tablet Take 20 mg by mouth daily.  Marland Kitchen dicyclomine (BENTYL) 10 MG capsule Take 10 mg by mouth 4 (four) times daily -  before meals and at bedtime.  Marland Kitchen ELIQUIS 5 MG TABS tablet Take 5 mg by mouth 2 (two) times daily.  Marland Kitchen KLOR-CON M20 20 MEQ tablet Take 1 tablet by mouth in the morning and at bedtime.  Marland Kitchen losartan-hydrochlorothiazide (HYZAAR) 100-25 MG per tablet Take 1 tablet by mouth daily.  . Multiple Vitamin (MULTIVITAMIN) tablet Take 1 tablet by mouth daily.  . pravastatin (PRAVACHOL) 40 MG tablet Take 40 mg by mouth daily.  . [DISCONTINUED] CALCIUM PO Take 1 tablet by mouth in the morning and at bedtime.     Allergies:   Morphine, Morphine and related, Erythromycin, Penicillins, Sulfa antibiotics, Sulfasalazine, Tetracycline, and Acetaminophen    Social History   Socioeconomic History  . Marital status: Married    Spouse name: Not on file  . Number of children: 1  . Years of education: Not on file  . Highest education level: Not on file  Occupational History  . Occupation: retired    Comment: office work  Tobacco Use  . Smoking status: Never Smoker  . Smokeless tobacco: Never Used  Substance and Sexual Activity  . Alcohol use: Yes    Comment: rarely  . Drug use: No  . Sexual activity: Not on file  Other Topics Concern  . Not on file  Social History Narrative  . Not on file   Social Determinants of Health   Financial Resource Strain: Not on file  Food Insecurity: Not on file  Transportation Needs: Not on file  Physical Activity: Not on file  Stress: Not on file  Social Connections: Not on file     Family History: The patient's family history includes Brain cancer in her father; Diabetes in her brother and sister; Hypertension in her mother.  ROS:   ROS Please see the history of present illness.     All other systems reviewed and are negative.  EKGs/Labs/Other Studies Reviewed:    The following studies were reviewed today:   EKG:  EKG is  ordered today.  The ekg ordered today is personally reviewed and demonstrates atrial fibrillation controlled ventricular rate low voltage EKG consider old anterior MI.   Physical Exam:    VS:  BP 91/64 (BP Location: Left Arm, Patient Position: Sitting)   Pulse 94   Ht 5\' 5"  (1.651 m)   Wt 168 lb 6.4 oz (76.4 kg)   SpO2 98%   BMI 28.02 kg/m     Wt Readings from Last 3 Encounters:  10/14/20 168 lb 6.4 oz (76.4 kg)  06/27/13 176 lb (79.8 kg)  05/16/13 177 lb (80.3 kg)     GEN: No pallor of the skin or membranes well nourished, well developed in no acute distress HEENT: Normal NECK: No JVD; No carotid bruits LYMPHATICS: No lymphadenopathy CARDIAC: Irregular rhythm variable first heart sound, no murmurs, rubs, gallops RESPIRATORY:  Clear to auscultation  without rales, wheezing or rhonchi  ABDOMEN: Soft, non-tender, non-distended MUSCULOSKELETAL:  No edema; No deformity  SKIN: Warm and dry NEUROLOGIC:  Alert and oriented x 3 PSYCHIATRIC:  Normal affect     Signed, Shirlee More, MD  10/14/2020 3:52 PM    [facility]

## 2020-10-14 ENCOUNTER — Encounter: Payer: Self-pay | Admitting: Cardiology

## 2020-10-14 ENCOUNTER — Other Ambulatory Visit: Payer: Self-pay

## 2020-10-14 ENCOUNTER — Ambulatory Visit: Payer: Medicare HMO | Admitting: Cardiology

## 2020-10-14 VITALS — BP 91/64 | HR 94 | Ht 65.0 in | Wt 168.4 lb

## 2020-10-14 DIAGNOSIS — E782 Mixed hyperlipidemia: Secondary | ICD-10-CM | POA: Diagnosis not present

## 2020-10-14 DIAGNOSIS — I4891 Unspecified atrial fibrillation: Secondary | ICD-10-CM

## 2020-10-14 DIAGNOSIS — I10 Essential (primary) hypertension: Secondary | ICD-10-CM

## 2020-10-14 DIAGNOSIS — R55 Syncope and collapse: Secondary | ICD-10-CM | POA: Diagnosis not present

## 2020-10-14 DIAGNOSIS — I4811 Longstanding persistent atrial fibrillation: Secondary | ICD-10-CM | POA: Diagnosis not present

## 2020-10-14 NOTE — Patient Instructions (Addendum)
Medication Instructions:  Your physician has recommended you make the following change in your medication:  STOP: Amlodipine CONTINUE: Carvedilol Only take your Hyzaar if your blood pressure is greater than 150 on top.  *If you need a refill on your cardiac medications before your next appointment, please call your pharmacy*   Lab Work: None If you have labs (blood work) drawn today and your tests are completely normal, you will receive your results only by: Marland Kitchen MyChart Message (if you have MyChart) OR . A paper copy in the mail If you have any lab test that is abnormal or we need to change your treatment, we will call you to review the results.   Testing/Procedures: None   Follow-Up: At [facility], you and your health needs are our priority.  As part of our continuing mission to provide you with exceptional heart care, we have created designated Provider Care Teams.  These Care Teams include your primary Cardiologist (physician) and Advanced Practice Providers (APPs -  Physician Assistants and Nurse Practitioners) who all work together to provide you with the care you need, when you need it.  We recommend signing up for the patient portal called "MyChart".  Sign up information is provided on this After Visit Summary.  MyChart is used to connect with patients for Virtual Visits (Telemedicine).  Patients are able to view lab/test results, encounter notes, upcoming appointments, etc.  Non-urgent messages can be sent to your provider as well.   To learn more about what you can do with MyChart, go to [email or web address].    Your next appointment:   8 week(s)  The format for your next appointment:   In Person  Provider:   Shirlee More, MD   Other Instructions

## 2020-10-23 DIAGNOSIS — I129 Hypertensive chronic kidney disease with stage 1 through stage 4 chronic kidney disease, or unspecified chronic kidney disease: Secondary | ICD-10-CM | POA: Diagnosis not present

## 2020-10-23 DIAGNOSIS — N182 Chronic kidney disease, stage 2 (mild): Secondary | ICD-10-CM | POA: Diagnosis not present

## 2020-10-27 DIAGNOSIS — N182 Chronic kidney disease, stage 2 (mild): Secondary | ICD-10-CM | POA: Diagnosis not present

## 2020-10-27 DIAGNOSIS — I959 Hypotension, unspecified: Secondary | ICD-10-CM | POA: Diagnosis not present

## 2020-10-27 DIAGNOSIS — I129 Hypertensive chronic kidney disease with stage 1 through stage 4 chronic kidney disease, or unspecified chronic kidney disease: Secondary | ICD-10-CM | POA: Diagnosis not present

## 2020-10-27 DIAGNOSIS — R7303 Prediabetes: Secondary | ICD-10-CM | POA: Diagnosis not present

## 2020-10-29 DIAGNOSIS — I361 Nonrheumatic tricuspid (valve) insufficiency: Secondary | ICD-10-CM

## 2020-10-29 DIAGNOSIS — R55 Syncope and collapse: Secondary | ICD-10-CM | POA: Diagnosis not present

## 2020-10-30 ENCOUNTER — Telehealth: Payer: Self-pay | Admitting: Cardiology

## 2020-10-30 NOTE — Telephone Encounter (Signed)
Call from Dr. Nyra Capes discussed with her the patient's echocardiogram done at [place] EF 35% and she will send a copy to my office for me to review.

## 2020-11-11 DIAGNOSIS — R519 Headache, unspecified: Secondary | ICD-10-CM | POA: Diagnosis not present

## 2020-11-11 DIAGNOSIS — N182 Chronic kidney disease, stage 2 (mild): Secondary | ICD-10-CM | POA: Diagnosis not present

## 2020-11-11 DIAGNOSIS — Z6828 Body mass index (BMI) 28.0-28.9, adult: Secondary | ICD-10-CM | POA: Diagnosis not present

## 2020-11-11 DIAGNOSIS — I129 Hypertensive chronic kidney disease with stage 1 through stage 4 chronic kidney disease, or unspecified chronic kidney disease: Secondary | ICD-10-CM | POA: Diagnosis not present

## 2020-11-13 DIAGNOSIS — R55 Syncope and collapse: Secondary | ICD-10-CM | POA: Diagnosis not present

## 2020-11-16 DIAGNOSIS — I129 Hypertensive chronic kidney disease with stage 1 through stage 4 chronic kidney disease, or unspecified chronic kidney disease: Secondary | ICD-10-CM | POA: Diagnosis not present

## 2020-11-23 DIAGNOSIS — I129 Hypertensive chronic kidney disease with stage 1 through stage 4 chronic kidney disease, or unspecified chronic kidney disease: Secondary | ICD-10-CM | POA: Diagnosis not present

## 2020-11-24 DIAGNOSIS — I959 Hypotension, unspecified: Secondary | ICD-10-CM | POA: Diagnosis not present

## 2020-11-24 DIAGNOSIS — N182 Chronic kidney disease, stage 2 (mild): Secondary | ICD-10-CM | POA: Diagnosis not present

## 2020-11-24 DIAGNOSIS — R7303 Prediabetes: Secondary | ICD-10-CM | POA: Diagnosis not present

## 2020-11-24 DIAGNOSIS — I129 Hypertensive chronic kidney disease with stage 1 through stage 4 chronic kidney disease, or unspecified chronic kidney disease: Secondary | ICD-10-CM | POA: Diagnosis not present

## 2020-12-08 NOTE — Progress Notes (Signed)
Cardiology Office Note:    Date:  12/09/2020   ID:  Abigail Foster, DOB Jun 06, 1946, MRN [id number]  PCP:  Jacelyn Pi, Lilia Argue, MD  Cardiologist:  Shirlee More, MD    Referring MD: Marco Collie, MD    ASSESSMENT:    1. Syncope and collapse   2. Other cardiomyopathy ([place])   3. Hypertensive heart disease without heart failure   4. Longstanding persistent atrial fibrillation ([place])   5. Chronic anticoagulation   6. Mixed hyperlipidemia    PLAN:    In order of problems listed above:  1. She has had 2 further episodes quite suggestive of arrhythmia especially bradycardia will place a live ZIO monitor 30 days an uneventful should have an implanted loop recorder. 2. I am concerned with her decrease in ejection fraction we will go ahead and screen for CAD with a myocardial perfusion study also give [place] an ejection fraction and if abnormal would favor coronary angiography.  Unfortunately atrial fibrillation is problematic for cardiac CTA with gating. 3. Stable she has had no hypotension since we stopped her calcium channel blocker and I do not think it is the mechanism of her syncope. 4. Stable rate controlled continue her beta-blocker await results of monitor myocardial perfusion study 5. Continue her anticoagulant 6. Continue her statin   Next appointment: 1 month   Medication Adjustments/Labs and Tests Ordered: Current medicines are reviewed at length with the patient today.  Concerns regarding medicines are outlined above.  Orders Placed This Encounter  Procedures  . CT CARDIAC SCORING (SELF PAY ONLY)  . MYOCARDIAL PERFUSION IMAGING  . LONG TERM MONITOR-LIVE TELEMETRY (3-14 DAYS)  . EKG 12-Lead   No orders of the defined types were placed in this encounter.   Chief Complaint  Patient presents with  . Follow-up    She has had 2 syncopal episodes since her last visit    History of Present Illness:    Abigail Foster is a 75 y.o. female with a hx of  longstanding persistent atrial fibrillation hypertension CKD COPD hyperlipidemia and lightheadedness/near syncope due to hypotension last seen 10/14/2020.Chart review, CTA chest without contrast 2014 with mild thoracic aortic atherosclerosis  Chest x-ray [facility] 06/23/2020 read as normal no active cardiopulmonary disease Echocardiogram [facility] 05/11/2020 with atrial fibrillation ejection fraction normal to low normal 50 to 55% mild left atrial enlargement mild mitral regurgitation contrast echo was performed without evidence of shunt.  CT angiogram head and neck performed the same date by Dr. Graylon Good in the emergency room showed no large vessel occlusion and no atheroma sclerosis. EKG [facility] 05/12/2019 shows atrial fibrillation controlled ventricular rate low voltage QRS nonspecific T waves  EKG [facility] 08/15/2009 showed sinus bradycardia 46 bpm nonspecific T wave abnormality.  At that time she was evaluated for syncope and near syncope.   She relates that she wore a monitor recently for over a week and was told it was abnormal and referred to my office will try to access that report.  She relates that she has had no bleeding with her anticoagulant.  Recent labs 09/08/2020 cholesterol 147 LDL 122 triglycerides 79 HDL 45 A1c 5.5%.   I received her monitor report by fax.  Duration 4 days she had no bradycardic events and most of the time when symptomatic her heart rates are in the range of 90 to 100 bpm.  Compliance with diet, lifestyle and medications:   Echo at [facility] 10/29/2020 with EF 35-40% mild  biatrial enlargement   Since her last home blood pressure has been in range no hypotension.  She has had 2 episodes of loss of consciousness 1 occurred when she was at [facility] in the ladies room she felt weak as if she would faint got into there went to the ground and fortunately was attended to by a individual seen by the ambulance squad and did not come to the hospital  she had another similar episode before that.  She has had some chest discomfort brief momentary stabbing but my biggest concern is her echocardiogram that shows a marked decrease in ejection fraction.  With her ongoing episodes of syncope cardiomyopathy atypical chest pain showed further evaluation including a live event monitor myocardial perfusion study and if the event monitor is unremarkable I think she would benefit from an implanted loop recorder.  I will continue her current medical therapy including beta-blocker ARB and reassess at the next visit after nuclear medicine ejection fraction about transition to [facility]. Past Medical History:  Diagnosis Date  . Aneurysm ([place]) 2000  . Asthma, chronic 06/27/2013   Followed in Pulmonary clinic/ [facility]/ [facility]  - PFT's 06/27/2013  FEV1 1.68 (69%) ratio 66 and 12% better p B2 and DLCO 71 corrects to 81  - hfa 50% p coaching 06/27/2013    . Chronic headache   . Chronic obstructive airway disease ([place])   . Dyspnea 05/19/2013   Followed in Pulmonary clinic/ [facility]/ [facility]    . HBP (high blood pressure) 06/30/2013   D/c coreg 05/20/13 > better 06/27/13 with reversible airflow obst on pfts    . HTN (hypertension)   . Hyperlipidemia   . Solitary pulmonary nodule 05/28/2013   CT chest 04/26/13 :   80mm LUL nodule, never smoker so 12 month f/u rec and entered into tickle file for recall 04/29/14> refused to schedule/ Dr Nyra Capes notified     Past Surgical History:  Procedure Laterality Date  . CATARACT EXTRACTION    . CHOLECYSTECTOMY  2001  . TOTAL ABDOMINAL HYSTERECTOMY  1983    Current Medications: Current Meds  Medication Sig  . albuterol (VENTOLIN HFA) 108 (90 Base) MCG/ACT inhaler Inhale 1 puff into the lungs every 4 (four) hours as needed.  . carvedilol (COREG) 12.5 MG tablet Take 12.5 mg by mouth 2 (two) times daily with a meal.  . cholecalciferol (VITAMIN D) 1000 UNITS tablet Take 1,000 Units by mouth daily.  . citalopram (CELEXA) 20  MG tablet Take 20 mg by mouth daily.  Marland Kitchen dicyclomine (BENTYL) 10 MG capsule Take 10 mg by mouth 4 (four) times daily -  before meals and at bedtime.  Marland Kitchen ELIQUIS 5 MG TABS tablet Take 5 mg by mouth 2 (two) times daily.  Marland Kitchen KLOR-CON M20 20 MEQ tablet Take 1 tablet by mouth in the morning and at bedtime.  Marland Kitchen losartan-hydrochlorothiazide (HYZAAR) 100-25 MG per tablet Take 1 tablet by mouth daily.  . Multiple Vitamin (MULTIVITAMIN) tablet Take 1 tablet by mouth daily.  . pravastatin (PRAVACHOL) 40 MG tablet Take 40 mg by mouth daily.     Allergies:   Morphine, Morphine and related, Erythromycin, Penicillins, Sulfa antibiotics, Sulfasalazine, Tetracycline, and Acetaminophen   Social History   Socioeconomic History  . Marital status: Married    Spouse name: Not on file  . Number of children: 1  . Years of education: Not on file  . Highest education level: Not on file  Occupational History  . Occupation: retired    Comment:  office work  Tobacco Use  . Smoking status: Never Smoker  . Smokeless tobacco: Never Used  Substance and Sexual Activity  . Alcohol use: Yes    Comment: rarely  . Drug use: No  . Sexual activity: Not on file  Other Topics Concern  . Not on file  Social History Narrative  . Not on file   Social Determinants of Health   Financial Resource Strain: Not on file  Food Insecurity: Not on file  Transportation Needs: Not on file  Physical Activity: Not on file  Stress: Not on file  Social Connections: Not on file     Family History: The patient's family history includes Brain cancer in her father; Diabetes in her brother and sister; Hypertension in her mother. ROS:   Please see the history of present illness.    All other systems reviewed and are negative.  EKGs/Labs/Other Studies Reviewed:    The following studies were reviewed today:  EKG:  EKG ordered today and personally reviewed.  The ekg ordered today demonstrates fibrillation controlled ventricular rate  low voltage nonspecific T waves consider old anterior MI    Physical Exam:    VS:  BP 126/81   Pulse 62   Ht 5\' 5"  (1.651 m)   Wt 172 lb 3.2 oz (78.1 kg)   SpO2 97%   BMI 28.66 kg/m     Wt Readings from Last 3 Encounters:  12/09/20 172 lb 3.2 oz (78.1 kg)  10/14/20 168 lb 6.4 oz (76.4 kg)  06/27/13 176 lb (79.8 kg)     GEN:  Well nourished, well developed in no acute distress HEENT: Normal NECK: No JVD; No carotid bruits LYMPHATICS: No lymphadenopathy CARDIAC: S1 variable irregular rhythm, no murmurs, rubs, gallops RESPIRATORY:  Clear to auscultation without rales, wheezing or rhonchi  ABDOMEN: Soft, non-tender, non-distended MUSCULOSKELETAL:  No edema; No deformity  SKIN: Warm and dry NEUROLOGIC:  Alert and oriented x 3 PSYCHIATRIC:  Normal affect    Signed, Shirlee More, MD  12/09/2020 3:12 PM    [facility]

## 2020-12-09 ENCOUNTER — Ambulatory Visit (INDEPENDENT_AMBULATORY_CARE_PROVIDER_SITE_OTHER): Payer: Medicare HMO

## 2020-12-09 ENCOUNTER — Encounter: Payer: Self-pay | Admitting: Cardiology

## 2020-12-09 ENCOUNTER — Other Ambulatory Visit: Payer: Self-pay

## 2020-12-09 ENCOUNTER — Ambulatory Visit: Payer: Medicare HMO | Admitting: Cardiology

## 2020-12-09 VITALS — BP 126/81 | HR 62 | Ht 65.0 in | Wt 172.2 lb

## 2020-12-09 DIAGNOSIS — I4811 Longstanding persistent atrial fibrillation: Secondary | ICD-10-CM | POA: Diagnosis not present

## 2020-12-09 DIAGNOSIS — R55 Syncope and collapse: Secondary | ICD-10-CM

## 2020-12-09 DIAGNOSIS — I428 Other cardiomyopathies: Secondary | ICD-10-CM | POA: Diagnosis not present

## 2020-12-09 DIAGNOSIS — Z7901 Long term (current) use of anticoagulants: Secondary | ICD-10-CM

## 2020-12-09 DIAGNOSIS — E782 Mixed hyperlipidemia: Secondary | ICD-10-CM | POA: Diagnosis not present

## 2020-12-09 DIAGNOSIS — I119 Hypertensive heart disease without heart failure: Secondary | ICD-10-CM

## 2020-12-09 NOTE — Patient Instructions (Signed)
Medication Instructions:  Your physician recommends that you continue on your current medications as directed. Please refer to the Current Medication list given to you today.  *If you need a refill on your cardiac medications before your next appointment, please call your pharmacy*   Lab Work: None If you have labs (blood work) drawn today and your tests are completely normal, you will receive your results only by: Marland Kitchen MyChart Message (if you have MyChart) OR . A paper copy in the mail If you have any lab test that is abnormal or we need to change your treatment, we will call you to review the results.   Testing/Procedures: A zio monitor was ordered today. It will remain on for 14 days. You will then return monitor and event diary in provided box. It takes 1-2 weeks for report to be downloaded and returned to [place]. We will call you with the results. If monitor falls off or has orange flashing light, please call Zio for further instructions.   We have put in the order for you to have a CT calcium score completed. They will call you to schedule this appointment.     [facility] [address] [place] [employer], [place] [address] Phone:  [phone]    Please arrive 15 minutes prior to your appointment time for registration and insurance purposes.  The test will take approximately 3 to 4 hours to complete; you may bring reading material.  If someone comes with you to your appointment, they will need to remain in the main lobby due to limited space in the testing area. **If you are pregnant or breastfeeding, please notify the nuclear lab prior to your appointment**  How to prepare for your Myocardial Perfusion Test: . Do not eat or drink 3 hours prior to your test, except you may have water. . Do not consume products containing caffeine (regular or decaffeinated) 12 hours prior to your test. (ex: coffee, chocolate, sodas, tea). . Do bring a list of your current medications  with you.  If not listed below, you may take your medications as normal. . Do wear comfortable clothes (no dresses or overalls) and walking shoes, tennis shoes preferred (No heels or open toe shoes are allowed). . Do NOT wear cologne, perfume, aftershave, or lotions (deodorant is allowed). . If these instructions are not followed, your test will have to be rescheduled.  Please report to [address] for your test.  If you have questions or concerns about your appointment, you can call the [place] at [phone].  If you cannot keep your appointment, please provide 24 hours notification to the Nuclear Lab, to avoid a possible $50 charge to your account.    Follow-Up: At [facility], you and your health needs are our priority.  As part of our continuing mission to provide you with exceptional heart care, we have created designated Provider Care Teams.  These Care Teams include your primary Cardiologist (physician) and Advanced Practice Providers (APPs -  Physician Assistants and Nurse Practitioners) who all work together to provide you with the care you need, when you need it.  We recommend signing up for the patient portal called "MyChart".  Sign up information is provided on this After Visit Summary.  MyChart is used to connect with patients for Virtual Visits (Telemedicine).  Patients are able to view lab/test results, encounter notes, upcoming appointments, etc.  Non-urgent messages can be sent to your provider as well.  To learn more about what you can do with MyChart, go to [email or web address].    Your next appointment:   4 week(s)  The format for your next appointment:   In Person  Provider:   Shirlee More, MD   Other Instructions

## 2020-12-10 ENCOUNTER — Telehealth: Payer: Self-pay | Admitting: Cardiology

## 2020-12-10 NOTE — Telephone Encounter (Signed)
[employer] calling to report an abnormal EKG.

## 2020-12-10 NOTE — Addendum Note (Signed)
Addended by: Shirlee More on: 12/10/2020 12:30 PM   Modules accepted: Orders

## 2020-12-10 NOTE — Telephone Encounter (Signed)
Spoke with [employer] rep who reports monitor alert (ZIO AT- notification) for 12/09/2020 at 7:06pm which shows Afib (90 sec) with avg HR of 82.  Rep reports pt was contacted and reports asymptomatic, sitting on couch watching TV. Will forward information to Dr Bettina Gavia and RN for review and further recommendation.  Pt is anticoagulated with Eliquis 5mg  bid.

## 2020-12-11 DIAGNOSIS — I4811 Longstanding persistent atrial fibrillation: Secondary | ICD-10-CM | POA: Diagnosis not present

## 2020-12-14 DIAGNOSIS — Z0000 Encounter for general adult medical examination without abnormal findings: Secondary | ICD-10-CM | POA: Diagnosis not present

## 2020-12-14 DIAGNOSIS — E669 Obesity, unspecified: Secondary | ICD-10-CM | POA: Diagnosis not present

## 2020-12-14 DIAGNOSIS — N951 Menopausal and female climacteric states: Secondary | ICD-10-CM | POA: Diagnosis not present

## 2020-12-14 DIAGNOSIS — Z139 Encounter for screening, unspecified: Secondary | ICD-10-CM | POA: Diagnosis not present

## 2020-12-14 DIAGNOSIS — Z1339 Encounter for screening examination for other mental health and behavioral disorders: Secondary | ICD-10-CM | POA: Diagnosis not present

## 2020-12-14 DIAGNOSIS — Z1331 Encounter for screening for depression: Secondary | ICD-10-CM | POA: Diagnosis not present

## 2020-12-14 DIAGNOSIS — N182 Chronic kidney disease, stage 2 (mild): Secondary | ICD-10-CM | POA: Diagnosis not present

## 2020-12-14 DIAGNOSIS — I129 Hypertensive chronic kidney disease with stage 1 through stage 4 chronic kidney disease, or unspecified chronic kidney disease: Secondary | ICD-10-CM | POA: Diagnosis not present

## 2020-12-16 ENCOUNTER — Telehealth (HOSPITAL_COMMUNITY): Payer: Self-pay | Admitting: *Deleted

## 2020-12-16 NOTE — Telephone Encounter (Signed)
Patient given detailed instructions per Myocardial Perfusion Study Information Sheet for the test on 12/17/20. Patient notified to arrive 15 minutes early and that it is imperative to arrive on time for appointment to keep from having the test rescheduled.  If you need to cancel or reschedule your appointment, please call the office within 24 hours of your appointment. . Patient verbalized understanding. Abigail Foster

## 2020-12-17 ENCOUNTER — Other Ambulatory Visit: Payer: Self-pay

## 2020-12-17 ENCOUNTER — Ambulatory Visit (INDEPENDENT_AMBULATORY_CARE_PROVIDER_SITE_OTHER): Payer: Medicare HMO

## 2020-12-17 ENCOUNTER — Telehealth: Payer: Self-pay

## 2020-12-17 DIAGNOSIS — Z7901 Long term (current) use of anticoagulants: Secondary | ICD-10-CM | POA: Diagnosis not present

## 2020-12-17 DIAGNOSIS — E782 Mixed hyperlipidemia: Secondary | ICD-10-CM

## 2020-12-17 DIAGNOSIS — I119 Hypertensive heart disease without heart failure: Secondary | ICD-10-CM

## 2020-12-17 DIAGNOSIS — I4811 Longstanding persistent atrial fibrillation: Secondary | ICD-10-CM | POA: Diagnosis not present

## 2020-12-17 DIAGNOSIS — I428 Other cardiomyopathies: Secondary | ICD-10-CM | POA: Diagnosis not present

## 2020-12-17 LAB — MYOCARDIAL PERFUSION IMAGING
LV dias vol: 61 mL (ref 46–106)
LV sys vol: 26 mL
Peak HR: 98 {beats}/min
Rest HR: 81 {beats}/min
SDS: 5
SRS: 8
SSS: 13
TID: 1.03

## 2020-12-17 MED ORDER — REGADENOSON 0.4 MG/5ML IV SOLN
0.4000 mg | Freq: Once | INTRAVENOUS | Status: AC
  Administered 2020-12-17: 0.4 mg via INTRAVENOUS

## 2020-12-17 MED ORDER — TECHNETIUM TC 99M TETROFOSMIN IV KIT
11.0000 | PACK | Freq: Once | INTRAVENOUS | Status: AC | PRN
  Administered 2020-12-17: 11 via INTRAVENOUS

## 2020-12-17 MED ORDER — TECHNETIUM TC 99M TETROFOSMIN IV KIT
32.4000 | PACK | Freq: Once | INTRAVENOUS | Status: AC | PRN
  Administered 2020-12-17: 32.4 via INTRAVENOUS

## 2020-12-17 MED ORDER — AMINOPHYLLINE 25 MG/ML IV SOLN
75.0000 mg | Freq: Once | INTRAVENOUS | Status: AC
  Administered 2020-12-17: 75 mg via INTRAVENOUS

## 2020-12-17 NOTE — Telephone Encounter (Signed)
-----   Message from Richardo Priest, MD sent at 12/17/2020  5:01 PM EDT ----- The results are Myoview test is normal.

## 2020-12-17 NOTE — Telephone Encounter (Signed)
Left message on patients voicemail to please return our call.   

## 2020-12-18 ENCOUNTER — Telehealth: Payer: Self-pay

## 2020-12-18 NOTE — Telephone Encounter (Signed)
Spoke with patient regarding results and recommendation.  Patient verbalizes understanding and is agreeable to plan of care. Advised patient to call back with any issues or concerns.  

## 2020-12-18 NOTE — Telephone Encounter (Signed)
-----   Message from Richardo Priest, MD sent at 12/17/2020  5:01 PM EDT ----- The results are Myoview test is normal.

## 2020-12-23 DIAGNOSIS — I4811 Longstanding persistent atrial fibrillation: Secondary | ICD-10-CM | POA: Diagnosis not present

## 2020-12-24 DIAGNOSIS — I10 Essential (primary) hypertension: Secondary | ICD-10-CM | POA: Diagnosis not present

## 2020-12-25 DIAGNOSIS — J449 Chronic obstructive pulmonary disease, unspecified: Secondary | ICD-10-CM | POA: Diagnosis not present

## 2020-12-25 DIAGNOSIS — I10 Essential (primary) hypertension: Secondary | ICD-10-CM | POA: Diagnosis not present

## 2020-12-25 DIAGNOSIS — E785 Hyperlipidemia, unspecified: Secondary | ICD-10-CM | POA: Diagnosis not present

## 2020-12-28 NOTE — Progress Notes (Signed)
Cardiology Office Note:    Date:  12/29/2020   ID:  Abigail Foster, DOB 05/09/1946, MRN [id number]  PCP:  Jacelyn Pi, Lilia Argue, MD  Cardiologist:  Shirlee More, MD    Referring MD: Jacelyn Pi, Lilia Argue, *    ASSESSMENT:    1. Longstanding persistent atrial fibrillation ([place])   2. Syncope and collapse   3. Hypertensive heart disease without heart failure   4. Chronic anticoagulation    PLAN:    In order of problems listed above:  1. Stable I think her symptoms are orthostatic in nature and now that we know she does not have a severe cardiomyopathy I think we can discontinue carvedilol code a low-dose of selective metoprolol continue anticoagulation trend her blood pressure sitting and standing and if she has another syncopal episode an implanted loop recorder is appropriate. 2. Continue her current anticoagulant   Next appointment: 6 months   Medication Adjustments/Labs and Tests Ordered: Current medicines are reviewed at length with the patient today.  Concerns regarding medicines are outlined above.  No orders of the defined types were placed in this encounter.  Meds ordered this encounter  Medications  . metoprolol succinate (TOPROL XL) 25 MG 24 hr tablet    Sig: Take 1 tablet (25 mg total) by mouth daily.    Dispense:  90 tablet    Refill:  3    No chief complaint on file.   History of Present Illness:    Abigail Foster is a 75 y.o. female with a hx of syncope longstanding persistent atrial fibrillation hypertension CKD COPD hyper lipidemia and mild thoracic atherosclerosis on CT scan.  She was last seen 12/09/2020.  Compliance with diet, lifestyle and medications: Yes  She has had no episodes of syncope but she still notices orthostatic lightheadedness.  She is taking carvedilol which can have a profound orthostatic affect we reviewed implanting a loop recorder she is hesitant so we will reduce withdrawal carvedilol her ejection fraction  is normal place her on low-dose selective beta-blocker metoprolol and if she has another episode agrees to have an implanted loop recorder.  I asked her to continue to trend blood pressure sitting and standing sent by phone to her PCP daily.  She had a myocardial perfusion study 12/17/2020 which showed normal perfusion and function EF calculated 58%  I reviewed her log of monitor daily reports symptomatic events although her atrial fibrillation with a controlled ventricular rate there are no pauses greater than 3 seconds or occasional PVCs and there are no episodes of repetitive PVCs present.  Past Medical History:  Diagnosis Date  . Aneurysm ([place]) 2000  . Asthma, chronic 06/27/2013   Followed in Pulmonary clinic/ [facility]/ [facility]  - PFT's 06/27/2013  FEV1 1.68 (69%) ratio 66 and 12% better p B2 and DLCO 71 corrects to 81  - hfa 50% p coaching 06/27/2013    . Chronic headache   . Chronic obstructive airway disease ([place])   . Dyspnea 05/19/2013   Followed in Pulmonary clinic/ [facility]/ [facility]    . HBP (high blood pressure) 06/30/2013   D/c coreg 05/20/13 > better 06/27/13 with reversible airflow obst on pfts    . HTN (hypertension)   . Hyperlipidemia   . Solitary pulmonary nodule 05/28/2013   CT chest 04/26/13 :   83mm LUL nodule, never smoker so 12 month f/u rec and entered into tickle file for recall 04/29/14> refused to schedule/ Dr Nyra Capes notified  Past Surgical History:  Procedure Laterality Date  . CATARACT EXTRACTION    . CHOLECYSTECTOMY  2001  . TOTAL ABDOMINAL HYSTERECTOMY  1983    Current Medications: Current Meds  Medication Sig  . albuterol (VENTOLIN HFA) 108 (90 Base) MCG/ACT inhaler Inhale 1 puff into the lungs every 4 (four) hours as needed.  . cholecalciferol (VITAMIN D) 1000 UNITS tablet Take 1,000 Units by mouth daily.  . citalopram (CELEXA) 20 MG tablet Take 20 mg by mouth daily.  Marland Kitchen dicyclomine (BENTYL) 10 MG capsule Take 10 mg by mouth 4 (four) times daily -   before meals and at bedtime.  Marland Kitchen ELIQUIS 5 MG TABS tablet Take 5 mg by mouth 2 (two) times daily.  Marland Kitchen KLOR-CON M20 20 MEQ tablet Take 1 tablet by mouth in the morning and at bedtime.  Marland Kitchen losartan-hydrochlorothiazide (HYZAAR) 100-25 MG per tablet Take 1 tablet by mouth daily.  . metoprolol succinate (TOPROL XL) 25 MG 24 hr tablet Take 1 tablet (25 mg total) by mouth daily.  . Multiple Vitamin (MULTIVITAMIN) tablet Take 1 tablet by mouth daily.  . pravastatin (PRAVACHOL) 40 MG tablet Take 40 mg by mouth daily.  Marland Kitchen PREMARIN vaginal cream Place vaginally.  . [DISCONTINUED] carvedilol (COREG) 12.5 MG tablet Take 12.5 mg by mouth 2 (two) times daily with a meal.     Allergies:   Morphine, Morphine and related, Erythromycin, Penicillins, Sulfa antibiotics, Sulfasalazine, Tetracycline, and Acetaminophen   Social History   Socioeconomic History  . Marital status: Married    Spouse name: Not on file  . Number of children: 1  . Years of education: Not on file  . Highest education level: Not on file  Occupational History  . Occupation: retired    Comment: office work  Tobacco Use  . Smoking status: Never Smoker  . Smokeless tobacco: Never Used  Substance and Sexual Activity  . Alcohol use: Yes    Comment: rarely  . Drug use: No  . Sexual activity: Not on file  Other Topics Concern  . Not on file  Social History Narrative  . Not on file   Social Determinants of Health   Financial Resource Strain: Not on file  Food Insecurity: Not on file  Transportation Needs: Not on file  Physical Activity: Not on file  Stress: Not on file  Social Connections: Not on file     Family History: The patient's family history includes Brain cancer in her father; Diabetes in her brother and sister; Hypertension in her mother. ROS:   Please see the history of present illness.    All other systems reviewed and are negative.  EKGs/Labs/Other Studies Reviewed:    The following studies were reviewed  today:   Recent Labs: She relates that she has had no bleeding with her anticoagulant.  Recent labs 09/08/2020 cholesterol 147 LDL 122 triglycerides 79 HDL 45 A1c 5.5%.    Physical Exam:    VS:  BP 106/78 (BP Location: Right Arm, Patient Position: Sitting)   Pulse 82   Ht 5\' 5"  (1.651 m)   Wt 172 lb 3.2 oz (78.1 kg)   SpO2 98%   BMI 28.66 kg/m     Wt Readings from Last 3 Encounters:  12/29/20 172 lb 3.2 oz (78.1 kg)  12/17/20 172 lb (78 kg)  12/09/20 172 lb 3.2 oz (78.1 kg)     GEN:  Well nourished, well developed in no acute distress HEENT: Normal NECK: No JVD; No carotid bruits LYMPHATICS: No lymphadenopathy  CARDIAC: RRR, no murmurs, rubs, gallops RESPIRATORY:  Clear to auscultation without rales, wheezing or rhonchi  ABDOMEN: Soft, non-tender, non-distended MUSCULOSKELETAL:  No edema; No deformity  SKIN: Warm and dry NEUROLOGIC:  Alert and oriented x 3 PSYCHIATRIC:  Normal affect    Signed, Shirlee More, MD  12/29/2020 3:00 PM    [place]

## 2020-12-29 ENCOUNTER — Other Ambulatory Visit: Payer: Self-pay

## 2020-12-29 ENCOUNTER — Ambulatory Visit: Payer: Medicare HMO | Admitting: Cardiology

## 2020-12-29 VITALS — BP 106/78 | HR 82 | Ht 65.0 in | Wt 172.2 lb

## 2020-12-29 DIAGNOSIS — I4811 Longstanding persistent atrial fibrillation: Secondary | ICD-10-CM | POA: Diagnosis not present

## 2020-12-29 DIAGNOSIS — R55 Syncope and collapse: Secondary | ICD-10-CM

## 2020-12-29 DIAGNOSIS — Z7901 Long term (current) use of anticoagulants: Secondary | ICD-10-CM

## 2020-12-29 DIAGNOSIS — I119 Hypertensive heart disease without heart failure: Secondary | ICD-10-CM | POA: Diagnosis not present

## 2020-12-29 MED ORDER — METOPROLOL SUCCINATE ER 25 MG PO TB24: 25.0000 mg | ORAL_TABLET | Freq: Every day | ORAL | 3 refills | Status: DC

## 2020-12-29 NOTE — Patient Instructions (Signed)
Medication Instructions:  Your physician has recommended you make the following change in your medication:  STOP: Carvedilol  START: TOPROL XL 25 mg take one tablet by mouth daily.  *If you need a refill on your cardiac medications before your next appointment, please call your pharmacy*   Lab Work: None If you have labs (blood work) drawn today and your tests are completely normal, you will receive your results only by: Marland Kitchen MyChart Message (if you have MyChart) OR . A paper copy in the mail If you have any lab test that is abnormal or we need to change your treatment, we will call you to review the results.   Testing/Procedures: None   Follow-Up: At [facility], you and your health needs are our priority.  As part of our continuing mission to provide you with exceptional heart care, we have created designated Provider Care Teams.  These Care Teams include your primary Cardiologist (physician) and Advanced Practice Providers (APPs -  Physician Assistants and Nurse Practitioners) who all work together to provide you with the care you need, when you need it.  We recommend signing up for the patient portal called "MyChart".  Sign up information is provided on this After Visit Summary.  MyChart is used to connect with patients for Virtual Visits (Telemedicine).  Patients are able to view lab/test results, encounter notes, upcoming appointments, etc.  Non-urgent messages can be sent to your provider as well.   To learn more about what you can do with MyChart, go to [email or web address].    Your next appointment:   6 month(s)  The format for your next appointment:   In Person  Provider:   Shirlee More, MD   Other Instructions

## 2020-12-29 NOTE — Progress Notes (Signed)
I reviewed her log of monitor daily reports symptomatic events although her atrial fibrillation with a controlled ventricular rate there are no pauses greater than 3 seconds or occasional PVCs and there are no episodes of repetitive PVCs present.

## 2021-01-04 ENCOUNTER — Telehealth: Payer: Self-pay

## 2021-01-04 NOTE — Telephone Encounter (Signed)
Tried calling patient. No answer and no voicemail set up for me to leave a message. 

## 2021-01-04 NOTE — Telephone Encounter (Signed)
Patient calling back. She states she also cannot take the metoprolol anymore.  Pt c/o medication issue:  1. Name of Medication: metoprolol succinate (TOPROL XL) 25 MG 24 hr tablet  2. How are you currently taking this medication (dosage and times per day)? 1 tablet daily  3. Are you having a reaction (difficulty breathing--STAT)? no  4. What is your medication issue? Patient states the medication causes her to have a very bad headache. She states it feels like a vise is on her head. She states she has been in bed all weekend until the medication wears off.

## 2021-01-04 NOTE — Telephone Encounter (Signed)
-----   Message from Richardo Priest, MD sent at 01/02/2021  3:53 PM EDT ----- Good result no slow heart rhythms.

## 2021-01-04 NOTE — Telephone Encounter (Signed)
Called and spoke to patient. Informed her to stop metoprolol per Dr. Bettina Gavia. She will make pcp aware as well in case there is something else going on.

## 2021-01-04 NOTE — Telephone Encounter (Signed)
Stop her beta-blocker I am concerned about other causes contacted received by her PCP is to be very uncommon for beta-blocker to cause headache like that

## 2021-01-05 ENCOUNTER — Telehealth: Payer: Self-pay

## 2021-01-05 DIAGNOSIS — R7303 Prediabetes: Secondary | ICD-10-CM | POA: Diagnosis not present

## 2021-01-05 DIAGNOSIS — E785 Hyperlipidemia, unspecified: Secondary | ICD-10-CM | POA: Diagnosis not present

## 2021-01-05 NOTE — Telephone Encounter (Signed)
Spoke with patient regarding results and recommendation.  Patient verbalizes understanding and is agreeable to plan of care. Advised patient to call back with any issues or concerns.  

## 2021-01-05 NOTE — Telephone Encounter (Signed)
Left message on patients voicemail to please return our call.   

## 2021-01-05 NOTE — Telephone Encounter (Signed)
-----   Message from Richardo Priest, MD sent at 01/02/2021  3:53 PM EDT ----- Good result no slow heart rhythms.

## 2021-01-05 NOTE — Telephone Encounter (Signed)
Patient returning call.

## 2021-01-12 ENCOUNTER — Telehealth: Payer: Self-pay

## 2021-01-12 ENCOUNTER — Other Ambulatory Visit: Payer: Self-pay

## 2021-01-12 ENCOUNTER — Ambulatory Visit (INDEPENDENT_AMBULATORY_CARE_PROVIDER_SITE_OTHER)
Admission: RE | Admit: 2021-01-12 | Discharge: 2021-01-12 | Disposition: A | Discharge status: Home / Self Care | Payer: Self-pay | Source: Ambulatory Visit | Attending: Cardiology | Admitting: Cardiology

## 2021-01-12 DIAGNOSIS — I428 Other cardiomyopathies: Secondary | ICD-10-CM

## 2021-01-12 NOTE — Telephone Encounter (Signed)
Tried calling patient. No answer and no voicemail set up for me to leave a message. 

## 2021-01-12 NOTE — Telephone Encounter (Signed)
-----   Message from Richardo Priest, MD sent at 01/12/2021  3:09 PM EDT ----- Doristine Devoid result the calcium score is 0 it be very unlikely to have any significant blockage of the heart arteries.

## 2021-01-13 ENCOUNTER — Telehealth: Payer: Self-pay

## 2021-01-13 NOTE — Telephone Encounter (Signed)
Spoke with patient regarding results and recommendation.  Patient verbalizes understanding and is agreeable to plan of care. Advised patient to call back with any issues or concerns.  

## 2021-01-13 NOTE — Telephone Encounter (Signed)
-----   Message from Richardo Priest, MD sent at 01/12/2021  3:09 PM EDT ----- Doristine Devoid result the calcium score is 0 it be very unlikely to have any significant blockage of the heart arteries.

## 2021-01-15 DIAGNOSIS — N182 Chronic kidney disease, stage 2 (mild): Secondary | ICD-10-CM | POA: Diagnosis not present

## 2021-01-15 DIAGNOSIS — E785 Hyperlipidemia, unspecified: Secondary | ICD-10-CM | POA: Diagnosis not present

## 2021-01-15 DIAGNOSIS — R7303 Prediabetes: Secondary | ICD-10-CM | POA: Diagnosis not present

## 2021-01-15 DIAGNOSIS — E876 Hypokalemia: Secondary | ICD-10-CM | POA: Diagnosis not present

## 2021-01-15 DIAGNOSIS — I129 Hypertensive chronic kidney disease with stage 1 through stage 4 chronic kidney disease, or unspecified chronic kidney disease: Secondary | ICD-10-CM | POA: Diagnosis not present

## 2021-01-15 DIAGNOSIS — J449 Chronic obstructive pulmonary disease, unspecified: Secondary | ICD-10-CM | POA: Diagnosis not present

## 2021-01-23 DIAGNOSIS — I10 Essential (primary) hypertension: Secondary | ICD-10-CM | POA: Diagnosis not present

## 2021-01-24 DIAGNOSIS — E785 Hyperlipidemia, unspecified: Secondary | ICD-10-CM | POA: Diagnosis not present

## 2021-01-24 DIAGNOSIS — J449 Chronic obstructive pulmonary disease, unspecified: Secondary | ICD-10-CM | POA: Diagnosis not present

## 2021-01-24 DIAGNOSIS — I10 Essential (primary) hypertension: Secondary | ICD-10-CM | POA: Diagnosis not present

## 2021-01-25 DIAGNOSIS — Z1231 Encounter for screening mammogram for malignant neoplasm of breast: Secondary | ICD-10-CM | POA: Diagnosis not present

## 2021-02-03 DIAGNOSIS — D485 Neoplasm of uncertain behavior of skin: Secondary | ICD-10-CM | POA: Diagnosis not present

## 2021-02-03 DIAGNOSIS — L821 Other seborrheic keratosis: Secondary | ICD-10-CM | POA: Diagnosis not present

## 2021-02-03 DIAGNOSIS — C44622 Squamous cell carcinoma of skin of right upper limb, including shoulder: Secondary | ICD-10-CM | POA: Diagnosis not present

## 2021-02-10 DIAGNOSIS — C44612 Basal cell carcinoma of skin of right upper limb, including shoulder: Secondary | ICD-10-CM | POA: Diagnosis not present

## 2021-02-23 DIAGNOSIS — I10 Essential (primary) hypertension: Secondary | ICD-10-CM | POA: Diagnosis not present

## 2021-02-24 DIAGNOSIS — I10 Essential (primary) hypertension: Secondary | ICD-10-CM | POA: Diagnosis not present

## 2021-02-24 DIAGNOSIS — J449 Chronic obstructive pulmonary disease, unspecified: Secondary | ICD-10-CM | POA: Diagnosis not present

## 2021-02-24 DIAGNOSIS — E785 Hyperlipidemia, unspecified: Secondary | ICD-10-CM | POA: Diagnosis not present

## 2021-03-25 DIAGNOSIS — I10 Essential (primary) hypertension: Secondary | ICD-10-CM | POA: Diagnosis not present

## 2021-03-26 DIAGNOSIS — I10 Essential (primary) hypertension: Secondary | ICD-10-CM | POA: Diagnosis not present

## 2021-03-26 DIAGNOSIS — J449 Chronic obstructive pulmonary disease, unspecified: Secondary | ICD-10-CM | POA: Diagnosis not present

## 2021-04-16 DIAGNOSIS — M7062 Trochanteric bursitis, left hip: Secondary | ICD-10-CM | POA: Diagnosis not present

## 2021-04-16 DIAGNOSIS — M7061 Trochanteric bursitis, right hip: Secondary | ICD-10-CM | POA: Diagnosis not present

## 2021-04-17 ENCOUNTER — Encounter: Payer: Self-pay | Admitting: Gastroenterology

## 2021-04-25 DIAGNOSIS — I10 Essential (primary) hypertension: Secondary | ICD-10-CM | POA: Diagnosis not present

## 2021-04-26 DIAGNOSIS — J449 Chronic obstructive pulmonary disease, unspecified: Secondary | ICD-10-CM | POA: Diagnosis not present

## 2021-04-26 DIAGNOSIS — E785 Hyperlipidemia, unspecified: Secondary | ICD-10-CM | POA: Diagnosis not present

## 2021-04-26 DIAGNOSIS — I10 Essential (primary) hypertension: Secondary | ICD-10-CM | POA: Diagnosis not present

## 2021-05-14 DIAGNOSIS — R7303 Prediabetes: Secondary | ICD-10-CM | POA: Diagnosis not present

## 2021-05-14 DIAGNOSIS — E785 Hyperlipidemia, unspecified: Secondary | ICD-10-CM | POA: Diagnosis not present

## 2021-05-21 DIAGNOSIS — E785 Hyperlipidemia, unspecified: Secondary | ICD-10-CM | POA: Diagnosis not present

## 2021-05-21 DIAGNOSIS — I129 Hypertensive chronic kidney disease with stage 1 through stage 4 chronic kidney disease, or unspecified chronic kidney disease: Secondary | ICD-10-CM | POA: Diagnosis not present

## 2021-05-21 DIAGNOSIS — I951 Orthostatic hypotension: Secondary | ICD-10-CM | POA: Diagnosis not present

## 2021-05-21 DIAGNOSIS — N182 Chronic kidney disease, stage 2 (mild): Secondary | ICD-10-CM | POA: Diagnosis not present

## 2021-05-26 DIAGNOSIS — I10 Essential (primary) hypertension: Secondary | ICD-10-CM | POA: Diagnosis not present

## 2021-05-27 DIAGNOSIS — I10 Essential (primary) hypertension: Secondary | ICD-10-CM | POA: Diagnosis not present

## 2021-05-27 DIAGNOSIS — E785 Hyperlipidemia, unspecified: Secondary | ICD-10-CM | POA: Diagnosis not present

## 2021-05-27 DIAGNOSIS — J449 Chronic obstructive pulmonary disease, unspecified: Secondary | ICD-10-CM | POA: Diagnosis not present

## 2021-06-25 DIAGNOSIS — I10 Essential (primary) hypertension: Secondary | ICD-10-CM | POA: Diagnosis not present

## 2021-06-26 DIAGNOSIS — E785 Hyperlipidemia, unspecified: Secondary | ICD-10-CM | POA: Diagnosis not present

## 2021-06-26 DIAGNOSIS — I10 Essential (primary) hypertension: Secondary | ICD-10-CM | POA: Diagnosis not present

## 2021-06-26 DIAGNOSIS — J449 Chronic obstructive pulmonary disease, unspecified: Secondary | ICD-10-CM | POA: Diagnosis not present

## 2021-07-01 ENCOUNTER — Ambulatory Visit (INDEPENDENT_AMBULATORY_CARE_PROVIDER_SITE_OTHER): Payer: Medicare HMO | Admitting: Gastroenterology

## 2021-07-01 ENCOUNTER — Encounter: Payer: Self-pay | Admitting: Gastroenterology

## 2021-07-01 VITALS — BP 116/80 | HR 65 | Ht 65.0 in | Wt 170.0 lb

## 2021-07-01 DIAGNOSIS — K580 Irritable bowel syndrome with diarrhea: Secondary | ICD-10-CM

## 2021-07-01 DIAGNOSIS — Z86010 Personal history of colonic polyps: Secondary | ICD-10-CM

## 2021-07-01 NOTE — Progress Notes (Signed)
Chief Complaint:   Referring Provider:  Marco Collie, MD      ASSESSMENT AND PLAN;   #1. H/O polyps  #2. IBS-D  Plan: -Colon with miralax prep after cardiac clearence and holding eliquis 24hrs before. Has appt Nov 1 with Dr Bettina Gavia   Discussed risks & benefits of colonoscopy. Risks including rare perforation req laparotomy, bleeding after bx/polypectomy req blood transfusion, rarely missing neoplasms, risks of anesthesia/sedation, rare risk of damage to internal organs. Benefits outweigh the risks. Patient agrees to proceed. All the questions were answered. Pt consents to proceed. HPI:    Gracy Ehly is a 75 y.o. female  With A Fib (EF 35-40% 10/2020) on eliquis, HTN, HLD, COPD, migraines  With H/O polyps  Occ diarrhea d/t IBS (after patotoes with skin)  No nausea, vomiting, heartburn, regurgitation, odynophagia or dysphagia.  No significant constipation.  No melena or hematochezia. No unintentional weight loss. No abdominal pain.  Past GI procedures Colonoscopy 11/2015 (PCF-some difficulty): 6 mm colon polyp s/p polypectomy, mild colonic diverticulosis.  Highly redundant colon.  Repeat in 5 years.  We will Past Medical History:  Diagnosis Date   Aneurysm ([place]) 2000   Anxiety    Asthma, chronic 06/27/2013   Followed in Pulmonary clinic/ [facility]/ [facility]  - PFT's 06/27/2013  FEV1 1.68 (69%) ratio 66 and 12% better p B2 and DLCO 71 corrects to 81  - hfa 50% p coaching 06/27/2013     Chronic headache    Chronic obstructive airway disease (HCC)    Dyspnea 05/19/2013   Followed in Pulmonary clinic/ [facility]/ [facility]     GERD (gastroesophageal reflux disease)    HBP (high blood pressure) 06/30/2013   D/c coreg 05/20/13 > better 06/27/13 with reversible airflow obst on pfts     History of colon polyps    HTN (hypertension)    Hyperlipidemia    IBS (irritable bowel syndrome)    Solitary pulmonary nodule 05/28/2013   CT chest 04/26/13 :   104mm LUL  nodule, never smoker so 12 month f/u rec and entered into tickle file for recall 04/29/14> refused to schedule/ Dr Nyra Capes notified     Past Surgical History:  Procedure Laterality Date   CATARACT EXTRACTION     CEREBRAL ANEURYSM REPAIR     brain aneurysm s/p surgery 2000 at [place]  09/27/1999   COLONOSCOPY  12/03/2015   Colonic polyp status post polypectomy. Mild left colonic diverticulosis. Highly redundant colon   TOTAL ABDOMINAL HYSTERECTOMY  09/26/1981    Family History  Problem Relation Age of Onset   Diabetes Brother    Diabetes Sister    Brain cancer Father    Hypertension Mother     Social History   Tobacco Use   Smoking status: Never   Smokeless tobacco: Never  Vaping Use   Vaping Use: Never used  Substance Use Topics   Alcohol use: Yes    Comment: rarely   Drug use: No    Current Outpatient Medications  Medication Sig Dispense Refill   albuterol (VENTOLIN HFA) 108 (90 Base) MCG/ACT inhaler Inhale 1 puff into the lungs every 4 (four) hours as needed.     carvedilol (COREG) 12.5 MG tablet Take 12.5 mg by mouth 2 (two) times daily.     cholecalciferol (VITAMIN D) 1000 UNITS tablet Take 1,000 Units by mouth daily.     citalopram (CELEXA) 20 MG tablet Take 20 mg by mouth daily.  dicyclomine (BENTYL) 10 MG capsule Take 10 mg by mouth 4 (four) times daily -  before meals and at bedtime.     ELIQUIS 5 MG TABS tablet Take 5 mg by mouth 2 (two) times daily.     KLOR-CON M20 20 MEQ tablet Take 1 tablet by mouth in the morning and at bedtime.     losartan-hydrochlorothiazide (HYZAAR) 100-25 MG per tablet Take 1 tablet by mouth daily.     Multiple Vitamin (MULTIVITAMIN) tablet Take 1 tablet by mouth daily.     pravastatin (PRAVACHOL) 40 MG tablet Take 40 mg by mouth daily.     No current facility-administered medications for this visit.    Allergies  Allergen Reactions   Morphine Shortness Of Breath   Morphine And Related Shortness Of Breath    Erythromycin Nausea Only   Sulfa Antibiotics Swelling   Sulfasalazine Swelling   Tetracycline Swelling   Acetaminophen Palpitations   Penicillins Rash    Review of Systems:  Constitutional: Denies fever, chills, diaphoresis, appetite change and fatigue.  HEENT: Denies photophobia, eye pain, redness, hearing loss, ear pain, congestion, sore throat, rhinorrhea, sneezing, mouth sores, neck pain, neck stiffness and tinnitus.   Respiratory: Denies SOB, DOE, cough, chest tightness,  and wheezing.   Cardiovascular: Denies chest pain, palpitations and leg swelling.  Genitourinary: Denies dysuria, urgency, frequency, hematuria, flank pain and difficulty urinating.  Musculoskeletal: Denies myalgias, back pain, joint swelling, arthralgias and gait problem.  Skin: No rash.  Neurological: Denies dizziness, seizures, syncope, weakness, light-headedness, numbness and headaches.  Hematological: Denies adenopathy. Easy bruising, personal or family bleeding history  Psychiatric/Behavioral: No anxiety or depression     Physical Exam:    BP 116/80   Pulse 65   Ht 5\' 5"  (1.651 m)   Wt 170 lb (77.1 kg)   SpO2 95%   BMI 28.29 kg/m  Wt Readings from Last 3 Encounters:  07/01/21 170 lb (77.1 kg)  12/29/20 172 lb 3.2 oz (78.1 kg)  12/17/20 172 lb (78 kg)   Constitutional:  Well-developed, in no acute distress. Psychiatric: Normal mood and affect. Behavior is normal. HEENT: Pupils normal.  Conjunctivae are normal. No scleral icterus. Neck supple.  Cardiovascular: Normal rate, regular rhythm. No edema Pulmonary/chest: Effort normal and breath sounds normal. No wheezing, rales or rhonchi. Abdominal: Soft, nondistended. Nontender. Bowel sounds active throughout. There are no masses palpable. No hepatomegaly. Rectal: Deferred Neurological: Alert and oriented to person place and time. Skin: Skin is warm and dry. No rashes noted.    Carmell Austria, MD 07/01/2021, 1:38 PM  Cc: Marco Collie, MD

## 2021-07-01 NOTE — Patient Instructions (Signed)
If you are age 74 or older, your body mass index should be between 23-30. Your Body mass index is 28.29 kg/m. If this is out of the aforementioned range listed, please consider follow up with your Primary Care Provider.  If you are age 55 or younger, your body mass index should be between 19-25. Your Body mass index is 28.29 kg/m. If this is out of the aformentioned range listed, please consider follow up with your Primary Care Provider.   __________________________________________________________  The [facility] providers would like to encourage you to use [facility] to communicate with providers for non-urgent requests or questions.  Due to long hold times on the telephone, sending your provider a message by [facility] may be a faster and more efficient way to get a response.  Please allow 48 business hours for a response.  Please remember that this is for non-urgent requests.   You have been scheduled for a colonoscopy. Please follow written instructions given to you at your visit today.  Please pick up your prep supplies at the pharmacy within the next 1-3 days. If you use inhalers (even only as needed), please bring them with you on the day of your procedure.  You will be contacted by our office prior to your procedure for directions on holding your Eliquis.  If you do not hear from our office 1 week prior to your scheduled procedure, please call [phone] to discuss.  Please ask your cardiologist about this.  Please call with any questions or concerns.  Thank you,  Dr. Jackquline Denmark

## 2021-07-19 DIAGNOSIS — Z860100 Personal history of colon polyps, unspecified: Secondary | ICD-10-CM | POA: Insufficient documentation

## 2021-07-19 DIAGNOSIS — K219 Gastro-esophageal reflux disease without esophagitis: Secondary | ICD-10-CM | POA: Insufficient documentation

## 2021-07-19 DIAGNOSIS — K589 Irritable bowel syndrome without diarrhea: Secondary | ICD-10-CM | POA: Insufficient documentation

## 2021-07-19 DIAGNOSIS — F419 Anxiety disorder, unspecified: Secondary | ICD-10-CM | POA: Insufficient documentation

## 2021-07-26 DIAGNOSIS — I10 Essential (primary) hypertension: Secondary | ICD-10-CM | POA: Diagnosis not present

## 2021-07-27 ENCOUNTER — Ambulatory Visit: Payer: Medicare HMO | Admitting: Cardiology

## 2021-07-27 ENCOUNTER — Telehealth: Payer: Self-pay

## 2021-07-27 ENCOUNTER — Other Ambulatory Visit: Payer: Self-pay

## 2021-07-27 ENCOUNTER — Encounter: Payer: Self-pay | Admitting: Cardiology

## 2021-07-27 VITALS — BP 100/62 | HR 80 | Ht 65.0 in | Wt 170.0 lb

## 2021-07-27 DIAGNOSIS — Z7901 Long term (current) use of anticoagulants: Secondary | ICD-10-CM | POA: Diagnosis not present

## 2021-07-27 DIAGNOSIS — E782 Mixed hyperlipidemia: Secondary | ICD-10-CM

## 2021-07-27 DIAGNOSIS — J449 Chronic obstructive pulmonary disease, unspecified: Secondary | ICD-10-CM | POA: Diagnosis not present

## 2021-07-27 DIAGNOSIS — E785 Hyperlipidemia, unspecified: Secondary | ICD-10-CM | POA: Diagnosis not present

## 2021-07-27 DIAGNOSIS — I129 Hypertensive chronic kidney disease with stage 1 through stage 4 chronic kidney disease, or unspecified chronic kidney disease: Secondary | ICD-10-CM

## 2021-07-27 DIAGNOSIS — I951 Orthostatic hypotension: Secondary | ICD-10-CM | POA: Diagnosis not present

## 2021-07-27 DIAGNOSIS — I4811 Longstanding persistent atrial fibrillation: Secondary | ICD-10-CM | POA: Diagnosis not present

## 2021-07-27 DIAGNOSIS — I10 Essential (primary) hypertension: Secondary | ICD-10-CM | POA: Diagnosis not present

## 2021-07-27 MED ORDER — CARVEDILOL 6.25 MG PO TABS: 6.2500 mg | ORAL_TABLET | Freq: Two times a day (BID) | ORAL | 3 refills | Status: AC

## 2021-07-27 NOTE — Addendum Note (Signed)
Addended by: Resa Miner I on: 07/27/2021 03:58 PM   Modules accepted: Orders

## 2021-07-27 NOTE — Patient Instructions (Signed)
Medication Instructions:  Your physician has recommended you make the following change in your medication:  DECREASE: Carvedilol to 6.25 mg by mouth twice daily. Check your blood pressure twice daily. Once in the morning before medication and once in the evening. If your blood pressure is less than 120 in the evening do not take your evening dose of carvedilol.  *If you need a refill on your cardiac medications before your next appointment, please call your pharmacy*   Lab Work: None If you have labs (blood work) drawn today and your tests are completely normal, you will receive your results only by: [place] (if you have MyChart) OR A paper copy in the mail If you have any lab test that is abnormal or we need to change your treatment, we will call you to review the results.   Testing/Procedures: Your physician has requested that you have an echocardiogram. Echocardiography is a painless test that uses sound waves to create images of your heart. It provides your doctor with information about the size and shape of your heart and how well your heart's chambers and valves are working. This procedure takes approximately one hour. There are no restrictions for this procedure.    Follow-Up: At [facility], you and your health needs are our priority.  As part of our continuing mission to provide you with exceptional heart care, we have created designated Provider Care Teams.  These Care Teams include your primary Cardiologist (physician) and Advanced Practice Providers (APPs -  Physician Assistants and Nurse Practitioners) who all work together to provide you with the care you need, when you need it.  We recommend signing up for the patient portal called "MyChart".  Sign up information is provided on this After Visit Summary.  MyChart is used to connect with patients for Virtual Visits (Telemedicine).  Patients are able to view lab/test results, encounter notes, upcoming appointments, etc.   Non-urgent messages can be sent to your provider as well.   To learn more about what you can do with MyChart, go to [email or web address].    Your next appointment:   6 week(s)  The format for your next appointment:   In Person  Provider:   Shirlee More, MD   Other Instructions

## 2021-07-27 NOTE — Telephone Encounter (Signed)
 [facility] Pre-operative Risk Assessment     Request for surgical clearance:     Endoscopy Procedure  What type of surgery is being performed?    Colonoscopy  When is this surgery scheduled?     08-12-2021  What type of clearance is required ?   Pharmacy  Are there any medications that need to be held prior to surgery and how long? Eliquis 24 hour hold  Practice name and name of physician performing surgery?      [facility]  What is your office phone and fax number?      Phone- [phone]  [phone]   Anesthesia type (None, local, MAC, general) ?       MAC

## 2021-07-27 NOTE — Progress Notes (Signed)
Cardiology Office Note:    Date:  07/27/2021   ID:  Abigail Foster, DOB Jul 02, 1946, MRN [id number]  PCP:  Abigail Collie, MD  Cardiologist:  Abigail More, MD    Referring MD: Abigail Collie, MD    ASSESSMENT:    1. Orthostatic hypotension   2. Longstanding persistent atrial fibrillation ([place])   3. Chronic anticoagulation   4. Hypertensive chronic kidney disease, unspecified CKD stage   5. Mixed hyperlipidemia    PLAN:    In order of problems listed above:  Is a difficult case to manage she continues to take the same medications and continues to have the same problem with symptomatic orthostatic hypotension.  I have asked her to begin to record her blood pressure in the morning and evening before taking her medications reduce the dose of carvedilol and hold on taking her ARB thiazide in the evening is less than 120.systolic Continue a beta-blocker for rate control and her anticoagulant Blood pressure changes as noted.  Nuclear perfusion study in the office showed normal function she had an echocardiogram at [facility] Reduced EF we will recheck her echocardiogram prior to colonoscopy we will expedite this in the office Continue statin  Next appointment: 6 weeks   Medication Adjustments/Labs and Tests Ordered: Current medicines are reviewed at length with the patient today.  Concerns regarding medicines are outlined above.  No orders of the defined types were placed in this encounter.  No orders of the defined types were placed in this encounter.   Chief Complaint  Patient presents with   Follow-up  Continues to have symptomatic hypotension  History of Present Illness:    Abigail Foster is a 75 y.o. female with a hx of longstanding persistent atrial fibrillation hypertensive heart disease with CKD COPD hyperlipidemia and thoracic aortic atherosclerosis last seen 12/29/2020 with lightheadedness and orthostatic hypotension.  She had a myocardial  perfusion study 12/17/2020 that showed normal perfusion and function EF calculated 58% she also had a event monitor that showed no significant bradycardia.  Compliance with diet, lifestyle and medications: Yes  I attempted to switch her from cardiopulmonary and heart intensity metoprolol which she tells me she had a migraine headache She continues to take her current antihypertensives and continues to have symptomatic hypotension. She does not check her blood pressure before taking medication She finds her self weak and fatigued when she showers and tells me her blood pressure is less than 7209 systolic She complains of shortness of breath remains very vigorous and active no edema orthopnea palpitation or syncope. She relates to me there is a hesitance of GI to do a colonoscopy because of her heart disease Past Medical History:  Diagnosis Date   Aneurysm ([place]) 2000   Anxiety    Asthma, chronic 06/27/2013   Followed in Pulmonary clinic/ [facility]/ [facility]  - PFT's 06/27/2013  FEV1 1.68 (69%) ratio 66 and 12% better p B2 and DLCO 71 corrects to 81  - hfa 50% p coaching 06/27/2013     Chronic headache    Chronic obstructive airway disease (HCC)    Dyspnea 05/19/2013   Followed in Pulmonary clinic/ [facility]/ [facility]     GERD (gastroesophageal reflux disease)    HBP (high blood pressure) 06/30/2013   D/c coreg 05/20/13 > better 06/27/13 with reversible airflow obst on pfts     History of colon polyps    HTN (hypertension)    Hyperlipidemia    IBS (irritable bowel syndrome)  Solitary pulmonary nodule 05/28/2013   CT chest 04/26/13 :   35mm LUL nodule, never smoker so 12 month f/u rec and entered into tickle file for recall 04/29/14> refused to schedule/ Dr Nyra Capes notified     Past Surgical History:  Procedure Laterality Date   CATARACT EXTRACTION     CEREBRAL ANEURYSM REPAIR     brain aneurysm s/p surgery 2000 at [place]  09/27/1999   COLONOSCOPY  12/03/2015    Colonic polyp status post polypectomy. Mild left colonic diverticulosis. Highly redundant colon   TOTAL ABDOMINAL HYSTERECTOMY  09/26/1981    Current Medications: Current Meds  Medication Sig   albuterol (VENTOLIN HFA) 108 (90 Base) MCG/ACT inhaler Inhale 1 puff into the lungs every 4 (four) hours as needed for wheezing or shortness of breath.   carvedilol (COREG) 12.5 MG tablet Take 12.5 mg by mouth 2 (two) times daily.   cholecalciferol (VITAMIN D) 1000 UNITS tablet Take 1,000 Units by mouth daily.   citalopram (CELEXA) 20 MG tablet Take 20 mg by mouth daily.   dicyclomine (BENTYL) 10 MG capsule Take 10 mg by mouth 4 (four) times daily -  before meals and at bedtime.   ELIQUIS 5 MG TABS tablet Take 5 mg by mouth 2 (two) times daily.   KLOR-CON M20 20 MEQ tablet Take 1 tablet by mouth in the morning and at bedtime.   losartan-hydrochlorothiazide (HYZAAR) 100-25 MG per tablet Take 1 tablet by mouth daily.   Multiple Vitamin (MULTIVITAMIN) tablet Take 1 tablet by mouth daily.   pravastatin (PRAVACHOL) 40 MG tablet Take 40 mg by mouth daily.     Allergies:   Morphine, Morphine and related, Erythromycin, Sulfa antibiotics, Sulfasalazine, Tetracycline, Acetaminophen, and Penicillins   Social History   Socioeconomic History   Marital status: Married    Spouse name: Not on file   Number of children: 1   Years of education: Not on file   Highest education level: Not on file  Occupational History   Occupation: retired    Comment: office work  Tobacco Use   Smoking status: Never   Smokeless tobacco: Never  Scientific laboratory technician Use: Never used  Substance and Sexual Activity   Alcohol use: Yes    Comment: rarely   Drug use: No   Sexual activity: Not on file  Other Topics Concern   Not on file  Social History Narrative   Not on file   Social Determinants of Health   Financial Resource Strain: Not on file  Food Insecurity: Not on file  Transportation Needs: Not on file  Physical  Activity: Not on file  Stress: Not on file  Social Connections: Not on file     Family History: The patient's family history includes Brain cancer in her father; Diabetes in her brother and sister; Hypertension in her mother. ROS:   Please see the history of present illness.    All other systems reviewed and are negative.  EKGs/Labs/Other Studies Reviewed:    The following studies were reviewed today:   Recent Labs:   Physical Exam:    VS:  BP 100/62 (BP Location: Left Arm, Patient Position: Sitting, Cuff Size: Normal)   Pulse 80   Ht 5\' 5"  (1.651 m)   Wt 170 lb (77.1 kg)   SpO2 97%   BMI 28.29 kg/m     Wt Readings from Last 3 Encounters:  07/27/21 170 lb (77.1 kg)  07/01/21 170 lb (77.1 kg)  12/29/20 172  lb 3.2 oz (78.1 kg)     GEN: She does not appear ill well nourished, well developed in no acute distress HEENT: Normal NECK: No JVD; No carotid bruits LYMPHATICS: No lymphadenopathy CARDIAC: Irregular rate and rhythm no murmurs, rubs, gallops RESPIRATORY:  Clear to auscultation without rales, wheezing or rhonchi  ABDOMEN: Soft, non-tender, non-distended MUSCULOSKELETAL:  No edema; No deformity  SKIN: Warm and dry NEUROLOGIC:  Alert and oriented x 3 PSYCHIATRIC:  Normal affect    Signed, Abigail More, MD  07/27/2021 3:25 PM     [facility]

## 2021-07-28 NOTE — Telephone Encounter (Signed)
Just trying to follow back up on this

## 2021-07-28 NOTE — Telephone Encounter (Signed)
No labs in Epic, Care Everywhere or [facility].   Patient will need metabolic panel and CBC for determination

## 2021-07-29 NOTE — Telephone Encounter (Signed)
[facility] pt, Im not sure if they draw their own labs or if they are outsourced. Please advise on where/how orders are put in. Thanks.

## 2021-07-29 NOTE — Telephone Encounter (Signed)
Spoke with patient to make her aware of the need to have labs done and she says that her primary care provider, Marco Collie, MD does her labs and she had them drawn recently. She will have them fax the lab results to the office. I made her aware that depending on the length of time that it has been since she had them drawn they may need to be repeated. She verbalized her understanding and agrees with this plan.

## 2021-07-30 NOTE — Telephone Encounter (Signed)
Abigail Foster from [facility] is returning [employer]. She states the most recent labs they have on the patient are a CMP from 05/14/21.

## 2021-07-30 NOTE — Telephone Encounter (Signed)
I will get them scanned in once I receive the labs.

## 2021-07-30 NOTE — Telephone Encounter (Signed)
Left message for PCP office to fax over recent labs BMET, & CBC on the pt. Left message that our office is being asked to give cardiac clearance for an upcoming procedure with another office. Our pre op pharm-d is asking for pt to have lab work done. See notes from [place], [place] yesterday where she s/w the pt about needing labs done.   Our office has not yet received any labs for this pt yet. I did leave our fax# [phone] as well as our office ph# [phone] if someone from PCP would call me back or fax labs results to [place] for the pt's pre op clearance for [facility].

## 2021-08-02 NOTE — Telephone Encounter (Signed)
Labs from PCP have been received. I will document the lab results and forward back to pre op and pharm-d for review. I will have the paper fax of lab results scanned into the chart today.   CMP 05/14/21 FROM DR. BETH HODGES (PCP)  BUN: 16 CREATININE: 0.93 eGFR: 64 SODIUM: 140 K+ 4.0 CHLORIDE: 100 CO2:27 CALCIUM: 9.4 PROTEIN: 6.2 ALBUMIN: 3.6 BILIRUBIN: 1.2 ALP: 56 ALT: 13 AST: 13

## 2021-08-02 NOTE — Telephone Encounter (Addendum)
Patient with diagnosis of atrial fibrillation on Eliquis for anticoagulation.    Procedure: colonoscopy Date of procedure: 08-12-21  CHA2DS2-VASc Score = 4  This indicates a 4.8% annual risk of stroke. The patient's score is based upon: CHF History: 0 HTN History: 1 Diabetes History: 0 Stroke History: 0 Vascular Disease History: 1 Age Score: 1 Gender Score: 1  CrCl 65  No record of CBC  Per office protocol, patient can hold Eliquis for 1 day prior to procedure as requested

## 2021-08-02 NOTE — Telephone Encounter (Signed)
I s/w PCP office in regard to lab results CMP 05/14/21, if these could please be faxed to our office [phone] fax#. I explained that cardiology is being asked to give clearance for upcoming procedure, that the pt was going to need lab work done with our office. Pt tells [place] that she had recent lab work with PCP. See previous notes.   Lab results for [facility] 05/14/21 are being faxed to our office today, ATTN: Levie Owensby/Pre Op team.

## 2021-08-03 NOTE — Telephone Encounter (Signed)
Will route back to the requesting surgeon's office, they will call and instruct pt.

## 2021-08-03 NOTE — Telephone Encounter (Signed)
Please inform the patient to hold Eliquis for 1 day prior to the procedure.

## 2021-08-05 NOTE — Telephone Encounter (Signed)
Left patient VM @ [phone] to hold Eliquis for 1 day prior to procedure. Left name and contact no. to give [place] call back if she has any questions or concerns.

## 2021-08-06 NOTE — Telephone Encounter (Signed)
LVM for patient to call back. ?

## 2021-08-09 ENCOUNTER — Other Ambulatory Visit: Payer: Self-pay

## 2021-08-09 ENCOUNTER — Telehealth: Payer: Self-pay

## 2021-08-09 ENCOUNTER — Ambulatory Visit (INDEPENDENT_AMBULATORY_CARE_PROVIDER_SITE_OTHER): Payer: Medicare HMO

## 2021-08-09 DIAGNOSIS — I4811 Longstanding persistent atrial fibrillation: Secondary | ICD-10-CM

## 2021-08-09 LAB — ECHOCARDIOGRAM COMPLETE
Area-P 1/2: 3.95 cm2
Calc EF: 47.9 %
S' Lateral: 2.7 cm
Single Plane A2C EF: 52.7 %
Single Plane A4C EF: 50.4 %

## 2021-08-09 NOTE — Telephone Encounter (Signed)
Left message on patients voicemail to please return our call.   

## 2021-08-09 NOTE — Telephone Encounter (Signed)
Patient returned call. Informed to hold eliquis 1 day before procedure meaning do not take on Wednesday. Patient expressed understanding.

## 2021-08-09 NOTE — Telephone Encounter (Signed)
-----   Message from Richardo Priest, MD sent at 08/09/2021 12:54 PM EST ----- Heart muscle function is normal similar to what resolved when we did a Myoview test in our office No changes at this time

## 2021-08-10 ENCOUNTER — Telehealth: Payer: Self-pay

## 2021-08-10 NOTE — Telephone Encounter (Signed)
-----   Message from Richardo Priest, MD sent at 08/09/2021 12:54 PM EST ----- Heart muscle function is normal similar to what resolved when we did a Myoview test in our office No changes at this time

## 2021-08-10 NOTE — Telephone Encounter (Signed)
Spoke with patient regarding results and recommendation.  Patient verbalizes understanding and is agreeable to plan of care. Advised patient to call back with any issues or concerns.  

## 2021-08-12 ENCOUNTER — Encounter: Payer: Self-pay | Admitting: Gastroenterology

## 2021-08-12 ENCOUNTER — Ambulatory Visit (AMBULATORY_SURGERY_CENTER): Payer: Medicare HMO | Admitting: Gastroenterology

## 2021-08-12 ENCOUNTER — Other Ambulatory Visit: Payer: Self-pay

## 2021-08-12 VITALS — BP 113/81 | HR 108 | Temp 95.1°F | Resp 27 | Ht 65.0 in | Wt 170.0 lb

## 2021-08-12 DIAGNOSIS — K5730 Diverticulosis of large intestine without perforation or abscess without bleeding: Secondary | ICD-10-CM | POA: Diagnosis not present

## 2021-08-12 DIAGNOSIS — K580 Irritable bowel syndrome with diarrhea: Secondary | ICD-10-CM | POA: Diagnosis not present

## 2021-08-12 DIAGNOSIS — D122 Benign neoplasm of ascending colon: Secondary | ICD-10-CM | POA: Diagnosis not present

## 2021-08-12 DIAGNOSIS — R112 Nausea with vomiting, unspecified: Secondary | ICD-10-CM

## 2021-08-12 DIAGNOSIS — K640 First degree hemorrhoids: Secondary | ICD-10-CM | POA: Diagnosis not present

## 2021-08-12 DIAGNOSIS — D125 Benign neoplasm of sigmoid colon: Secondary | ICD-10-CM

## 2021-08-12 DIAGNOSIS — J449 Chronic obstructive pulmonary disease, unspecified: Secondary | ICD-10-CM | POA: Diagnosis not present

## 2021-08-12 DIAGNOSIS — R69 Illness, unspecified: Secondary | ICD-10-CM | POA: Diagnosis not present

## 2021-08-12 MED ORDER — SODIUM CHLORIDE 0.9 % IV SOLN: 500.0000 mL | INTRAVENOUS | Status: DC

## 2021-08-12 MED ORDER — SODIUM CHLORIDE 0.9 % IV SOLN
4.0000 mg | Freq: Once | INTRAVENOUS | Status: AC
  Administered 2021-08-12: 15:00:00 4 mg via INTRAVENOUS

## 2021-08-12 NOTE — Patient Instructions (Signed)
YOU HAD AN ENDOSCOPIC PROCEDURE TODAY AT [facility]:   Refer to the procedure report that was given to you for any specific questions about what was found during the examination.  If the procedure report does not answer your questions, please call your gastroenterologist to clarify.  If you requested that your care partner not be given the details of your procedure findings, then the procedure report has been included in a sealed envelope for you to review at your convenience later.  YOU SHOULD EXPECT: Some feelings of bloating in the abdomen. Passage of more gas than usual.  Walking can help get rid of the air that was put into your GI tract during the procedure and reduce the bloating. If you had a lower endoscopy (such as a colonoscopy or flexible sigmoidoscopy) you may notice spotting of blood in your stool or on the toilet paper. If you underwent a bowel prep for your procedure, you may not have a normal bowel movement for a few days.  **Handouts given on Polyps, Hemorrhoids and diverticulosis**  Please Note:  You might notice some irritation and congestion in your nose or some drainage.  This is from the oxygen used during your procedure.  There is no need for concern and it should clear up in a day or so.  SYMPTOMS TO REPORT IMMEDIATELY:  Following lower endoscopy (colonoscopy or flexible sigmoidoscopy):  Excessive amounts of blood in the stool  Significant tenderness or worsening of abdominal pains  Swelling of the abdomen that is new, acute  Fever of 100F or higher  For urgent or emergent issues, a gastroenterologist can be reached at any hour by calling [phone]. Do not use MyChart messaging for urgent concerns.    DIET:  We do recommend a small meal at first, but then you may proceed to your regular diet.  Drink plenty of fluids but you should avoid alcoholic beverages for 24 hours.  ACTIVITY:  You should plan to take it easy for the rest of today and you  should NOT DRIVE or use heavy machinery until tomorrow (because of the sedation medicines used during the test).    FOLLOW UP: Our staff will call the number listed on your records 48-72 hours following your procedure to check on you and address any questions or concerns that you may have regarding the information given to you following your procedure. If we do not reach you, we will leave a message.  We will attempt to reach you two times.  During this call, we will ask if you have developed any symptoms of COVID 19. If you develop any symptoms (ie: fever, flu-like symptoms, shortness of breath, cough etc.) before then, please call [phone].  If you test positive for Covid 19 in the 2 weeks post procedure, please call and report this information to [place].    If any biopsies were taken you will be contacted by phone or by letter within the next 1-3 weeks.  Please call us at [phone] if you have not heard about the biopsies in 3 weeks.    SIGNATURES/CONFIDENTIALITY: You and/or your care partner have signed paperwork which will be entered into your electronic medical record.  These signatures attest to the fact that that the information above on your After Visit Summary has been reviewed and is understood.  Full responsibility of the confidentiality of this discharge information lies with you and/or your care-partner.

## 2021-08-12 NOTE — Progress Notes (Signed)
Sedate, gd SR, tolerated procedure well, VSS, report to RN 

## 2021-08-12 NOTE — Progress Notes (Signed)
Patient severely nauseated.  Zofran 4 mg x 1 dilutedi n 10 ml normal saline, slow IV push ;  given per vo Dr Lyndel Safe

## 2021-08-12 NOTE — Progress Notes (Signed)
Pt's states no medical or surgical changes since previsit or office visit. 

## 2021-08-12 NOTE — Progress Notes (Signed)
Chief Complaint:   Referring Provider:  Marco Collie, MD      ASSESSMENT AND PLAN;   #1. H/O polyps  #2. IBS-D  Plan: -Colon with miralax prep after cardiac clearence and holding eliquis 24hrs before. Has appt Nov 1 with Dr Bettina Gavia   Discussed risks & benefits of colonoscopy. Risks including rare perforation req laparotomy, bleeding after bx/polypectomy req blood transfusion, rarely missing neoplasms, risks of anesthesia/sedation, rare risk of damage to internal organs. Benefits outweigh the risks. Patient agrees to proceed. All the questions were answered. Pt consents to proceed. HPI:    Verneal Foster is a 75 y.o. female  With A Fib (EF 35-40% 10/2020) on eliquis, HTN, HLD, COPD, migraines  With H/O polyps  Occ diarrhea d/t IBS (after patotoes with skin)  No nausea, vomiting, heartburn, regurgitation, odynophagia or dysphagia.  No significant constipation.  No melena or hematochezia. No unintentional weight loss. No abdominal pain.  Past GI procedures Colonoscopy 11/2015 ([facility]-some difficulty): 6 mm colon polyp s/p polypectomy, mild colonic diverticulosis.  Highly redundant colon.  Repeat in 5 years.  We will Past Medical History:  Diagnosis Date   Aneurysm ([place]) 2000   Anxiety    Asthma, chronic 06/27/2013   Followed in Pulmonary clinic/ [facility]/ [facility]  - PFT's 06/27/2013  FEV1 1.68 (69%) ratio 66 and 12% better p B2 and DLCO 71 corrects to 81  - hfa 50% p coaching 06/27/2013     Chronic headache    Chronic obstructive airway disease (HCC)    Dyspnea 05/19/2013   Followed in Pulmonary clinic/ [facility]/ [place]     GERD (gastroesophageal reflux disease)    HBP (high blood pressure) 06/30/2013   D/c coreg 05/20/13 > better 06/27/13 with reversible airflow obst on pfts     History of colon polyps    HTN (hypertension)    Hyperlipidemia    IBS (irritable bowel syndrome)    Solitary pulmonary nodule 05/28/2013   CT chest 04/26/13 :   36mm LUL  nodule, never smoker so 12 month f/u rec and entered into tickle file for recall 04/29/14> refused to schedule/ Dr Nyra Capes notified     Past Surgical History:  Procedure Laterality Date   CATARACT EXTRACTION     CEREBRAL ANEURYSM REPAIR     brain aneurysm s/p surgery 2000 at [facility]  09/27/1999   COLONOSCOPY  12/03/2015   Colonic polyp status post polypectomy. Mild left colonic diverticulosis. Highly redundant colon   TOTAL ABDOMINAL HYSTERECTOMY  09/26/1981    Family History  Problem Relation Age of Onset   Diabetes Brother    Diabetes Sister    Brain cancer Father    Hypertension Mother     Social History   Tobacco Use   Smoking status: Never   Smokeless tobacco: Never  Vaping Use   Vaping Use: Never used  Substance Use Topics   Alcohol use: Yes    Comment: rarely   Drug use: No    Current Outpatient Medications  Medication Sig Dispense Refill   carvedilol (COREG) 6.25 MG tablet Take 1 tablet (6.25 mg total) by mouth 2 (two) times daily. 180 tablet 3   citalopram (CELEXA) 20 MG tablet Take 20 mg by mouth daily.     dicyclomine (BENTYL) 10 MG capsule Take 10 mg by mouth 4 (four) times daily -  before meals and at bedtime.     ELIQUIS 5 MG TABS tablet Take 5 mg by mouth 2 (  two) times daily.     KLOR-CON M20 20 MEQ tablet Take 1 tablet by mouth in the morning and at bedtime.     losartan-hydrochlorothiazide (HYZAAR) 100-25 MG per tablet Take 1 tablet by mouth daily.     pravastatin (PRAVACHOL) 40 MG tablet Take 40 mg by mouth daily.     albuterol (VENTOLIN HFA) 108 (90 Base) MCG/ACT inhaler Inhale 1 puff into the lungs every 4 (four) hours as needed for wheezing or shortness of breath.     cholecalciferol (VITAMIN D) 1000 UNITS tablet Take 1,000 Units by mouth daily.     Multiple Vitamin (MULTIVITAMIN) tablet Take 1 tablet by mouth daily.     Current Facility-Administered Medications  Medication Dose Route Frequency Provider Last Rate Last Admin   0.9 %   sodium chloride infusion  500 mL Intravenous Continuous Jackquline Denmark, MD       ondansetron [facility]) 4 mg in sodium chloride 0.9 % 50 mL IVPB  4 mg Intravenous Once Jackquline Denmark, MD        Allergies  Allergen Reactions   Morphine Shortness Of Breath   Morphine And Related Shortness Of Breath   Erythromycin Nausea Only   Sulfa Antibiotics Swelling   Sulfasalazine Swelling   Tetracycline Swelling   Acetaminophen Palpitations   Penicillins Rash    Review of Systems:  Constitutional: Denies fever, chills, diaphoresis, appetite change and fatigue.  HEENT: Denies photophobia, eye pain, redness, hearing loss, ear pain, congestion, sore throat, rhinorrhea, sneezing, mouth sores, neck pain, neck stiffness and tinnitus.   Respiratory: Denies SOB, DOE, cough, chest tightness,  and wheezing.   Cardiovascular: Denies chest pain, palpitations and leg swelling.  Genitourinary: Denies dysuria, urgency, frequency, hematuria, flank pain and difficulty urinating.  Musculoskeletal: Denies myalgias, back pain, joint swelling, arthralgias and gait problem.  Skin: No rash.  Neurological: Denies dizziness, seizures, syncope, weakness, light-headedness, numbness and headaches.  Hematological: Denies adenopathy. Easy bruising, personal or family bleeding history  Psychiatric/Behavioral: No anxiety or depression     Physical Exam:    BP 112/67   Pulse 85   Temp (!) 95.1 F (35.1 C) (Temporal)   Ht 5\' 5"  (1.651 m)   Wt 170 lb (77.1 kg)   SpO2 99%   BMI 28.29 kg/m  Wt Readings from Last 3 Encounters:  08/12/21 170 lb (77.1 kg)  07/27/21 170 lb (77.1 kg)  07/01/21 170 lb (77.1 kg)   Constitutional:  Well-developed, in no acute distress. Psychiatric: Normal mood and affect. Behavior is normal. HEENT: Pupils normal.  Conjunctivae are normal. No scleral icterus. Neck supple.  Cardiovascular: Normal rate, regular rhythm. No edema Pulmonary/chest: Effort normal and breath sounds normal. No wheezing,  rales or rhonchi. Abdominal: Soft, nondistended. Nontender. Bowel sounds active throughout. There are no masses palpable. No hepatomegaly. Rectal: Deferred Neurological: Alert and oriented to person place and time. Skin: Skin is warm and dry. No rashes noted.    Carmell Austria, MD 08/12/2021, 3:08 PM  Cc: Marco Collie, MD

## 2021-08-12 NOTE — Op Note (Addendum)
[facility] Patient Name: Abigail Foster Procedure Date: 08/12/2021 3:08 PM MRN: [id number] Endoscopist: Jackquline Denmark , MD Age: 75 Referring MD:  Date of Birth: [birth year] Gender: Female Account #: [id number] Procedure:                Colonoscopy Indications:              High risk colon cancer surveillance: Personal                            history of colonic polyps Medicines:                Monitored Anesthesia Care Procedure:                Pre-Anesthesia Assessment:                           - Prior to the procedure, a History and Physical                            was performed, and patient medications and                            allergies were reviewed. The patient's tolerance of                            previous anesthesia was also reviewed. The risks                            and benefits of the procedure and the sedation                            options and risks were discussed with the patient.                            All questions were answered, and informed consent                            was obtained. Prior Anticoagulants: The patient has                            taken Eliquis (apixaban), last dose was 1 day prior                            to procedure. ASA Grade Assessment: III - A patient                            with severe systemic disease. After reviewing the                            risks and benefits, the patient was deemed in                            satisfactory condition to undergo the procedure.  After obtaining informed consent, the colonoscope                            was passed under direct vision. Throughout the                            procedure, the patient's blood pressure, pulse, and                            oxygen saturations were monitored continuously. The                            Olympus PCF-H190DL (#[id number]) Colonoscope was                            introduced through the anus and  advanced to the the                            cecum, identified by appendiceal orifice and                            ileocecal valve. The colonoscopy was performed with                            moderate difficulty due to restricted mobility of                            the colon and a redundant colon. Successful                            completion of the procedure was aided by applying                            abdominal pressure. The patient tolerated the                            procedure well. The quality of the bowel                            preparation was good. The ileocecal valve,                            appendiceal orifice, and rectum were photographed. Scope In: 3:19:57 PM Scope Out: 3:51:56 PM Scope Withdrawal Time: 0 hours 24 minutes 1 second  Total Procedure Duration: 0 hours 31 minutes 59 seconds  Findings:                 Difficult exam d/t " fixed sigmoid colon" and                            highly redundant remaining colon.                           A 10 mm polyp was found in the proximal ascending  colon. The polyp was semi-sessile. The polyp was                            removed with a hot snare. Resection and retrieval                            were complete.                           A 8 mm polyp was found in the distal sigmoid colon.                            The polyp was sessile. The polyp was removed with a                            hot snare. Resection and retrieval were complete.                           The colon (entire examined portion) appeared                            normal. Biopsies for histology were taken with a                            cold forceps from the entire colon for evaluation                            of microscopic colitis.                           Multiple medium-mouthed diverticula were found in                            the sigmoid colon and descending colon.                            Non-bleeding internal hemorrhoids were found during                            retroflexion. The hemorrhoids were mild, small and                            Grade I (internal hemorrhoids that do not prolapse).                           The exam was otherwise without abnormality on                            direct and retroflexion views. Complications:            No immediate complications. Estimated Blood Loss:     Estimated blood loss: none. Impression:               - One 10 mm polyp in the proximal ascending colon,  removed with a hot snare. Resected and retrieved.                           - One 8 mm polyp in the distal sigmoid colon,                            removed with a hot snare. Resected and retrieved.                           - The entire examined colon is normal. Biopsied.                           - Diverticulosis in the sigmoid colon and in the                            descending colon.                           - Non-bleeding internal hemorrhoids.                           - The examination was otherwise normal on direct                            and retroflexion views. Recommendation:           - Patient has a contact number available for                            emergencies. The signs and symptoms of potential                            delayed complications were discussed with the                            patient. Return to normal activities tomorrow.                            Written discharge instructions were provided to the                            patient.                           - Patient has a contact number available for                            emergencies. The signs and symptoms of potential                            delayed complications were discussed with the                            patient. Return to normal activities tomorrow.  Written discharge instructions were provided to the                             patient.                           - Resume previous diet.                           - Continue present medications.                           - Await pathology results.                           - Resume Eliquis (apixaban) at prior dose in 3 days                            (72 hrs).                           - The findings and recommendations were discussed                            with the patient's family. Jackquline Denmark, MD 08/12/2021 3:57:40 PM This report has been signed electronically. Addendum Number: 1   Addendum Date: 10/11/2021 7:57:46 AM      Add in Dx: IBS with diarrhea.      RG Jackquline Denmark, MD 10/11/2021 7:58:15 AM This report has been signed electronically.

## 2021-08-16 ENCOUNTER — Telehealth: Payer: Self-pay | Admitting: *Deleted

## 2021-08-16 NOTE — Telephone Encounter (Signed)
  Follow up Call-  Call back number 08/12/2021  Post procedure Call Back phone  # [phone]  Permission to leave phone message Yes  Some recent data might be hidden     Patient questions:  Do you have a fever, pain , or abdominal swelling? No. Pain Score  0 *  Have you tolerated food without any problems? Yes.    Have you been able to return to your normal activities? Yes.    Do you have any questions about your discharge instructions: Diet   No. Medications  No. Follow up visit  No.  Do you have questions or concerns about your Care? No. Pt states she has had severe acid reflux since the procedure and that she has not had an issue with this before the procedure. RN instructed pt that her procedure was routine and should not have caused any issues with acid reflux. Pt states she contacted her PCP and the nurse there told her to drink things with ginger in it and she states she has been drinking ginger ale and that has helped. RN also instructed pt that she could try any OTC acid reflux medication if needed. Pt also reported that the day after her procedure she got up to do things around the house and ended up passing out and falling over her husband's recliner at home. Pt states she is fine from the fall and did not get injured. RN to notify Dr. Lyndel Safe of pt's complaints.   Actions: * If pain score is 4 or above: No action needed, pain <4.  Have you developed a fever since your procedure? no  2.   Have you had an respiratory symptoms (SOB or cough) since your procedure? no  3.   Have you tested positive for COVID 19 since your procedure no  4.   Have you had any family members/close contacts diagnosed with the COVID 19 since your procedure?  no   If yes to any of these questions please route to Joylene John, RN and Joella Prince, RN

## 2021-08-22 ENCOUNTER — Encounter: Payer: Self-pay | Admitting: Gastroenterology

## 2021-08-25 DIAGNOSIS — I10 Essential (primary) hypertension: Secondary | ICD-10-CM | POA: Diagnosis not present

## 2021-08-26 DIAGNOSIS — L578 Other skin changes due to chronic exposure to nonionizing radiation: Secondary | ICD-10-CM | POA: Diagnosis not present

## 2021-08-26 DIAGNOSIS — J449 Chronic obstructive pulmonary disease, unspecified: Secondary | ICD-10-CM | POA: Diagnosis not present

## 2021-08-26 DIAGNOSIS — L814 Other melanin hyperpigmentation: Secondary | ICD-10-CM | POA: Diagnosis not present

## 2021-08-26 DIAGNOSIS — D225 Melanocytic nevi of trunk: Secondary | ICD-10-CM | POA: Diagnosis not present

## 2021-08-26 DIAGNOSIS — I129 Hypertensive chronic kidney disease with stage 1 through stage 4 chronic kidney disease, or unspecified chronic kidney disease: Secondary | ICD-10-CM | POA: Diagnosis not present

## 2021-08-26 DIAGNOSIS — L820 Inflamed seborrheic keratosis: Secondary | ICD-10-CM | POA: Diagnosis not present

## 2021-08-26 DIAGNOSIS — D485 Neoplasm of uncertain behavior of skin: Secondary | ICD-10-CM | POA: Diagnosis not present

## 2021-08-26 DIAGNOSIS — E785 Hyperlipidemia, unspecified: Secondary | ICD-10-CM | POA: Diagnosis not present

## 2021-09-02 DIAGNOSIS — L905 Scar conditions and fibrosis of skin: Secondary | ICD-10-CM | POA: Diagnosis not present

## 2021-09-02 DIAGNOSIS — D225 Melanocytic nevi of trunk: Secondary | ICD-10-CM | POA: Diagnosis not present

## 2021-09-07 DIAGNOSIS — I671 Cerebral aneurysm, nonruptured: Secondary | ICD-10-CM | POA: Diagnosis not present

## 2021-09-07 DIAGNOSIS — Z9889 Other specified postprocedural states: Secondary | ICD-10-CM | POA: Diagnosis not present

## 2021-09-09 DIAGNOSIS — E785 Hyperlipidemia, unspecified: Secondary | ICD-10-CM | POA: Diagnosis not present

## 2021-09-09 DIAGNOSIS — R7303 Prediabetes: Secondary | ICD-10-CM | POA: Diagnosis not present

## 2021-09-16 ENCOUNTER — Ambulatory Visit: Payer: Medicare HMO | Admitting: Cardiology

## 2021-09-16 DIAGNOSIS — I129 Hypertensive chronic kidney disease with stage 1 through stage 4 chronic kidney disease, or unspecified chronic kidney disease: Secondary | ICD-10-CM | POA: Diagnosis not present

## 2021-09-16 DIAGNOSIS — J449 Chronic obstructive pulmonary disease, unspecified: Secondary | ICD-10-CM | POA: Diagnosis not present

## 2021-09-16 DIAGNOSIS — N182 Chronic kidney disease, stage 2 (mild): Secondary | ICD-10-CM | POA: Diagnosis not present

## 2021-09-16 DIAGNOSIS — I4891 Unspecified atrial fibrillation: Secondary | ICD-10-CM | POA: Diagnosis not present

## 2021-09-26 DIAGNOSIS — I129 Hypertensive chronic kidney disease with stage 1 through stage 4 chronic kidney disease, or unspecified chronic kidney disease: Secondary | ICD-10-CM | POA: Diagnosis not present

## 2021-09-26 DIAGNOSIS — E782 Mixed hyperlipidemia: Secondary | ICD-10-CM | POA: Diagnosis not present

## 2021-09-26 DIAGNOSIS — J449 Chronic obstructive pulmonary disease, unspecified: Secondary | ICD-10-CM | POA: Diagnosis not present

## 2021-10-07 DIAGNOSIS — D485 Neoplasm of uncertain behavior of skin: Secondary | ICD-10-CM | POA: Diagnosis not present

## 2021-10-08 DIAGNOSIS — I10 Essential (primary) hypertension: Secondary | ICD-10-CM | POA: Diagnosis not present

## 2021-10-08 DIAGNOSIS — Z7901 Long term (current) use of anticoagulants: Secondary | ICD-10-CM | POA: Diagnosis not present

## 2021-10-08 DIAGNOSIS — I4891 Unspecified atrial fibrillation: Secondary | ICD-10-CM | POA: Diagnosis not present

## 2021-10-08 DIAGNOSIS — Z8679 Personal history of other diseases of the circulatory system: Secondary | ICD-10-CM | POA: Diagnosis not present

## 2021-10-08 DIAGNOSIS — I4821 Permanent atrial fibrillation: Secondary | ICD-10-CM | POA: Diagnosis not present

## 2021-10-27 DIAGNOSIS — E782 Mixed hyperlipidemia: Secondary | ICD-10-CM | POA: Diagnosis not present

## 2021-10-27 DIAGNOSIS — I129 Hypertensive chronic kidney disease with stage 1 through stage 4 chronic kidney disease, or unspecified chronic kidney disease: Secondary | ICD-10-CM | POA: Diagnosis not present

## 2021-10-27 DIAGNOSIS — J449 Chronic obstructive pulmonary disease, unspecified: Secondary | ICD-10-CM | POA: Diagnosis not present

## 2021-11-16 DIAGNOSIS — D6869 Other thrombophilia: Secondary | ICD-10-CM | POA: Diagnosis not present

## 2021-11-16 DIAGNOSIS — N182 Chronic kidney disease, stage 2 (mild): Secondary | ICD-10-CM | POA: Diagnosis not present

## 2021-11-16 DIAGNOSIS — I4891 Unspecified atrial fibrillation: Secondary | ICD-10-CM | POA: Diagnosis not present

## 2021-11-16 DIAGNOSIS — E875 Hyperkalemia: Secondary | ICD-10-CM | POA: Diagnosis not present

## 2021-11-16 DIAGNOSIS — I129 Hypertensive chronic kidney disease with stage 1 through stage 4 chronic kidney disease, or unspecified chronic kidney disease: Secondary | ICD-10-CM | POA: Diagnosis not present

## 2021-11-16 DIAGNOSIS — E876 Hypokalemia: Secondary | ICD-10-CM | POA: Diagnosis not present

## 2021-11-24 DIAGNOSIS — J449 Chronic obstructive pulmonary disease, unspecified: Secondary | ICD-10-CM | POA: Diagnosis not present

## 2021-11-24 DIAGNOSIS — E785 Hyperlipidemia, unspecified: Secondary | ICD-10-CM | POA: Diagnosis not present

## 2021-11-24 DIAGNOSIS — I129 Hypertensive chronic kidney disease with stage 1 through stage 4 chronic kidney disease, or unspecified chronic kidney disease: Secondary | ICD-10-CM | POA: Diagnosis not present

## 2021-11-26 ENCOUNTER — Ambulatory Visit: Payer: Medicare HMO | Admitting: Cardiology

## 2021-11-29 DIAGNOSIS — N182 Chronic kidney disease, stage 2 (mild): Secondary | ICD-10-CM | POA: Diagnosis not present

## 2021-11-29 DIAGNOSIS — I129 Hypertensive chronic kidney disease with stage 1 through stage 4 chronic kidney disease, or unspecified chronic kidney disease: Secondary | ICD-10-CM | POA: Diagnosis not present

## 2021-12-25 DIAGNOSIS — J441 Chronic obstructive pulmonary disease with (acute) exacerbation: Secondary | ICD-10-CM | POA: Diagnosis not present

## 2021-12-25 DIAGNOSIS — E785 Hyperlipidemia, unspecified: Secondary | ICD-10-CM | POA: Diagnosis not present

## 2022-01-06 DIAGNOSIS — E785 Hyperlipidemia, unspecified: Secondary | ICD-10-CM | POA: Diagnosis not present

## 2022-01-06 DIAGNOSIS — R7303 Prediabetes: Secondary | ICD-10-CM | POA: Diagnosis not present

## 2022-01-06 DIAGNOSIS — I129 Hypertensive chronic kidney disease with stage 1 through stage 4 chronic kidney disease, or unspecified chronic kidney disease: Secondary | ICD-10-CM | POA: Diagnosis not present

## 2022-01-12 DIAGNOSIS — R42 Dizziness and giddiness: Secondary | ICD-10-CM | POA: Diagnosis not present

## 2022-01-12 DIAGNOSIS — I4821 Permanent atrial fibrillation: Secondary | ICD-10-CM | POA: Diagnosis not present

## 2022-01-12 DIAGNOSIS — Z8679 Personal history of other diseases of the circulatory system: Secondary | ICD-10-CM | POA: Diagnosis not present

## 2022-01-12 DIAGNOSIS — Z7901 Long term (current) use of anticoagulants: Secondary | ICD-10-CM | POA: Diagnosis not present

## 2022-01-13 DIAGNOSIS — I129 Hypertensive chronic kidney disease with stage 1 through stage 4 chronic kidney disease, or unspecified chronic kidney disease: Secondary | ICD-10-CM | POA: Diagnosis not present

## 2022-01-13 DIAGNOSIS — Z139 Encounter for screening, unspecified: Secondary | ICD-10-CM | POA: Diagnosis not present

## 2022-01-13 DIAGNOSIS — N182 Chronic kidney disease, stage 2 (mild): Secondary | ICD-10-CM | POA: Diagnosis not present

## 2022-01-13 DIAGNOSIS — Z1331 Encounter for screening for depression: Secondary | ICD-10-CM | POA: Diagnosis not present

## 2022-01-13 DIAGNOSIS — E785 Hyperlipidemia, unspecified: Secondary | ICD-10-CM | POA: Diagnosis not present

## 2022-01-13 DIAGNOSIS — R7303 Prediabetes: Secondary | ICD-10-CM | POA: Diagnosis not present

## 2022-01-13 DIAGNOSIS — Z0000 Encounter for general adult medical examination without abnormal findings: Secondary | ICD-10-CM | POA: Diagnosis not present

## 2022-01-13 DIAGNOSIS — Z136 Encounter for screening for cardiovascular disorders: Secondary | ICD-10-CM | POA: Diagnosis not present

## 2022-01-13 DIAGNOSIS — Z1339 Encounter for screening examination for other mental health and behavioral disorders: Secondary | ICD-10-CM | POA: Diagnosis not present

## 2022-01-17 ENCOUNTER — Other Ambulatory Visit: Payer: Self-pay | Admitting: Family Medicine

## 2022-01-17 DIAGNOSIS — Z1231 Encounter for screening mammogram for malignant neoplasm of breast: Secondary | ICD-10-CM

## 2022-01-23 DIAGNOSIS — E669 Obesity, unspecified: Secondary | ICD-10-CM | POA: Diagnosis not present

## 2022-01-23 DIAGNOSIS — I129 Hypertensive chronic kidney disease with stage 1 through stage 4 chronic kidney disease, or unspecified chronic kidney disease: Secondary | ICD-10-CM | POA: Diagnosis not present

## 2022-01-24 DIAGNOSIS — J441 Chronic obstructive pulmonary disease with (acute) exacerbation: Secondary | ICD-10-CM | POA: Diagnosis not present

## 2022-01-24 DIAGNOSIS — E785 Hyperlipidemia, unspecified: Secondary | ICD-10-CM | POA: Diagnosis not present

## 2022-01-25 DIAGNOSIS — Z7901 Long term (current) use of anticoagulants: Secondary | ICD-10-CM | POA: Diagnosis not present

## 2022-01-25 DIAGNOSIS — I4821 Permanent atrial fibrillation: Secondary | ICD-10-CM | POA: Diagnosis not present

## 2022-01-25 DIAGNOSIS — R42 Dizziness and giddiness: Secondary | ICD-10-CM | POA: Diagnosis not present

## 2022-02-08 DIAGNOSIS — L814 Other melanin hyperpigmentation: Secondary | ICD-10-CM | POA: Diagnosis not present

## 2022-02-08 DIAGNOSIS — D2239 Melanocytic nevi of other parts of face: Secondary | ICD-10-CM | POA: Diagnosis not present

## 2022-02-08 DIAGNOSIS — D225 Melanocytic nevi of trunk: Secondary | ICD-10-CM | POA: Diagnosis not present

## 2022-02-08 DIAGNOSIS — R32 Unspecified urinary incontinence: Secondary | ICD-10-CM | POA: Diagnosis not present

## 2022-02-08 DIAGNOSIS — L821 Other seborrheic keratosis: Secondary | ICD-10-CM | POA: Diagnosis not present

## 2022-02-08 DIAGNOSIS — R350 Frequency of micturition: Secondary | ICD-10-CM | POA: Diagnosis not present

## 2022-02-10 DIAGNOSIS — I4821 Permanent atrial fibrillation: Secondary | ICD-10-CM | POA: Diagnosis not present

## 2022-02-10 DIAGNOSIS — M25551 Pain in right hip: Secondary | ICD-10-CM | POA: Diagnosis not present

## 2022-02-10 DIAGNOSIS — M160 Bilateral primary osteoarthritis of hip: Secondary | ICD-10-CM | POA: Diagnosis not present

## 2022-02-10 DIAGNOSIS — R42 Dizziness and giddiness: Secondary | ICD-10-CM | POA: Diagnosis not present

## 2022-02-10 DIAGNOSIS — Z6828 Body mass index (BMI) 28.0-28.9, adult: Secondary | ICD-10-CM | POA: Diagnosis not present

## 2022-02-11 DIAGNOSIS — I4891 Unspecified atrial fibrillation: Secondary | ICD-10-CM | POA: Diagnosis not present

## 2022-02-14 ENCOUNTER — Ambulatory Visit
Admission: RE | Admit: 2022-02-14 | Discharge: 2022-02-14 | Disposition: A | Discharge status: Home / Self Care | Payer: Medicare HMO | Source: Ambulatory Visit | Attending: Family Medicine | Admitting: Family Medicine

## 2022-02-14 DIAGNOSIS — Z1231 Encounter for screening mammogram for malignant neoplasm of breast: Secondary | ICD-10-CM | POA: Diagnosis not present

## 2022-02-23 DIAGNOSIS — I129 Hypertensive chronic kidney disease with stage 1 through stage 4 chronic kidney disease, or unspecified chronic kidney disease: Secondary | ICD-10-CM | POA: Diagnosis not present

## 2022-02-24 DIAGNOSIS — E785 Hyperlipidemia, unspecified: Secondary | ICD-10-CM | POA: Diagnosis not present

## 2022-02-24 DIAGNOSIS — I129 Hypertensive chronic kidney disease with stage 1 through stage 4 chronic kidney disease, or unspecified chronic kidney disease: Secondary | ICD-10-CM | POA: Diagnosis not present

## 2022-03-17 DIAGNOSIS — R350 Frequency of micturition: Secondary | ICD-10-CM | POA: Diagnosis not present

## 2022-03-17 DIAGNOSIS — R32 Unspecified urinary incontinence: Secondary | ICD-10-CM | POA: Diagnosis not present

## 2022-03-17 DIAGNOSIS — Z6828 Body mass index (BMI) 28.0-28.9, adult: Secondary | ICD-10-CM | POA: Diagnosis not present

## 2022-03-25 DIAGNOSIS — E669 Obesity, unspecified: Secondary | ICD-10-CM | POA: Diagnosis not present

## 2022-03-25 DIAGNOSIS — I129 Hypertensive chronic kidney disease with stage 1 through stage 4 chronic kidney disease, or unspecified chronic kidney disease: Secondary | ICD-10-CM | POA: Diagnosis not present

## 2022-03-26 DIAGNOSIS — I129 Hypertensive chronic kidney disease with stage 1 through stage 4 chronic kidney disease, or unspecified chronic kidney disease: Secondary | ICD-10-CM | POA: Diagnosis not present

## 2022-03-26 DIAGNOSIS — E785 Hyperlipidemia, unspecified: Secondary | ICD-10-CM | POA: Diagnosis not present

## 2022-03-30 DIAGNOSIS — M7061 Trochanteric bursitis, right hip: Secondary | ICD-10-CM | POA: Diagnosis not present

## 2022-03-30 DIAGNOSIS — M7062 Trochanteric bursitis, left hip: Secondary | ICD-10-CM | POA: Diagnosis not present

## 2022-04-04 DIAGNOSIS — I10 Essential (primary) hypertension: Secondary | ICD-10-CM | POA: Diagnosis not present

## 2022-04-04 DIAGNOSIS — R002 Palpitations: Secondary | ICD-10-CM | POA: Diagnosis not present

## 2022-04-04 DIAGNOSIS — E785 Hyperlipidemia, unspecified: Secondary | ICD-10-CM | POA: Diagnosis not present

## 2022-04-04 DIAGNOSIS — Z7901 Long term (current) use of anticoagulants: Secondary | ICD-10-CM | POA: Diagnosis not present

## 2022-04-04 DIAGNOSIS — I4819 Other persistent atrial fibrillation: Secondary | ICD-10-CM | POA: Diagnosis not present

## 2022-04-05 DIAGNOSIS — I4821 Permanent atrial fibrillation: Secondary | ICD-10-CM | POA: Diagnosis not present

## 2022-04-19 DIAGNOSIS — I4821 Permanent atrial fibrillation: Secondary | ICD-10-CM | POA: Diagnosis not present

## 2022-04-25 DIAGNOSIS — I129 Hypertensive chronic kidney disease with stage 1 through stage 4 chronic kidney disease, or unspecified chronic kidney disease: Secondary | ICD-10-CM | POA: Diagnosis not present

## 2022-04-25 DIAGNOSIS — E669 Obesity, unspecified: Secondary | ICD-10-CM | POA: Diagnosis not present

## 2022-04-26 DIAGNOSIS — E785 Hyperlipidemia, unspecified: Secondary | ICD-10-CM | POA: Diagnosis not present

## 2022-04-26 DIAGNOSIS — I129 Hypertensive chronic kidney disease with stage 1 through stage 4 chronic kidney disease, or unspecified chronic kidney disease: Secondary | ICD-10-CM | POA: Diagnosis not present

## 2022-05-25 DIAGNOSIS — I482 Chronic atrial fibrillation, unspecified: Secondary | ICD-10-CM | POA: Diagnosis not present

## 2022-05-25 DIAGNOSIS — Z7901 Long term (current) use of anticoagulants: Secondary | ICD-10-CM | POA: Diagnosis not present

## 2022-05-25 DIAGNOSIS — I4821 Permanent atrial fibrillation: Secondary | ICD-10-CM | POA: Diagnosis not present

## 2022-05-25 DIAGNOSIS — I358 Other nonrheumatic aortic valve disorders: Secondary | ICD-10-CM | POA: Diagnosis not present

## 2022-05-25 DIAGNOSIS — Z8679 Personal history of other diseases of the circulatory system: Secondary | ICD-10-CM | POA: Diagnosis not present

## 2022-05-25 DIAGNOSIS — R42 Dizziness and giddiness: Secondary | ICD-10-CM | POA: Diagnosis not present

## 2022-05-26 DIAGNOSIS — I129 Hypertensive chronic kidney disease with stage 1 through stage 4 chronic kidney disease, or unspecified chronic kidney disease: Secondary | ICD-10-CM | POA: Diagnosis not present

## 2022-05-26 DIAGNOSIS — R079 Chest pain, unspecified: Secondary | ICD-10-CM | POA: Diagnosis not present

## 2022-05-26 DIAGNOSIS — J441 Chronic obstructive pulmonary disease with (acute) exacerbation: Secondary | ICD-10-CM | POA: Diagnosis not present

## 2022-05-27 DIAGNOSIS — E785 Hyperlipidemia, unspecified: Secondary | ICD-10-CM | POA: Diagnosis not present

## 2022-05-27 DIAGNOSIS — I129 Hypertensive chronic kidney disease with stage 1 through stage 4 chronic kidney disease, or unspecified chronic kidney disease: Secondary | ICD-10-CM | POA: Diagnosis not present

## 2022-05-31 DIAGNOSIS — I129 Hypertensive chronic kidney disease with stage 1 through stage 4 chronic kidney disease, or unspecified chronic kidney disease: Secondary | ICD-10-CM | POA: Diagnosis not present

## 2022-05-31 DIAGNOSIS — Z1231 Encounter for screening mammogram for malignant neoplasm of breast: Secondary | ICD-10-CM | POA: Diagnosis not present

## 2022-05-31 DIAGNOSIS — K219 Gastro-esophageal reflux disease without esophagitis: Secondary | ICD-10-CM | POA: Diagnosis not present

## 2022-05-31 DIAGNOSIS — Z6827 Body mass index (BMI) 27.0-27.9, adult: Secondary | ICD-10-CM | POA: Diagnosis not present

## 2022-05-31 DIAGNOSIS — N182 Chronic kidney disease, stage 2 (mild): Secondary | ICD-10-CM | POA: Diagnosis not present

## 2022-06-07 DIAGNOSIS — I129 Hypertensive chronic kidney disease with stage 1 through stage 4 chronic kidney disease, or unspecified chronic kidney disease: Secondary | ICD-10-CM | POA: Diagnosis not present

## 2022-06-07 DIAGNOSIS — E785 Hyperlipidemia, unspecified: Secondary | ICD-10-CM | POA: Diagnosis not present

## 2022-06-07 DIAGNOSIS — R7303 Prediabetes: Secondary | ICD-10-CM | POA: Diagnosis not present

## 2022-06-09 DIAGNOSIS — E2839 Other primary ovarian failure: Secondary | ICD-10-CM | POA: Diagnosis not present

## 2022-06-09 DIAGNOSIS — M85852 Other specified disorders of bone density and structure, left thigh: Secondary | ICD-10-CM | POA: Diagnosis not present

## 2022-06-14 DIAGNOSIS — N182 Chronic kidney disease, stage 2 (mild): Secondary | ICD-10-CM | POA: Diagnosis not present

## 2022-06-14 DIAGNOSIS — E785 Hyperlipidemia, unspecified: Secondary | ICD-10-CM | POA: Diagnosis not present

## 2022-06-14 DIAGNOSIS — R7303 Prediabetes: Secondary | ICD-10-CM | POA: Diagnosis not present

## 2022-06-14 DIAGNOSIS — I129 Hypertensive chronic kidney disease with stage 1 through stage 4 chronic kidney disease, or unspecified chronic kidney disease: Secondary | ICD-10-CM | POA: Diagnosis not present

## 2022-06-14 DIAGNOSIS — Z6828 Body mass index (BMI) 28.0-28.9, adult: Secondary | ICD-10-CM | POA: Diagnosis not present

## 2022-06-25 DIAGNOSIS — J441 Chronic obstructive pulmonary disease with (acute) exacerbation: Secondary | ICD-10-CM | POA: Diagnosis not present

## 2022-06-25 DIAGNOSIS — I129 Hypertensive chronic kidney disease with stage 1 through stage 4 chronic kidney disease, or unspecified chronic kidney disease: Secondary | ICD-10-CM | POA: Diagnosis not present

## 2022-06-26 DIAGNOSIS — I129 Hypertensive chronic kidney disease with stage 1 through stage 4 chronic kidney disease, or unspecified chronic kidney disease: Secondary | ICD-10-CM | POA: Diagnosis not present

## 2022-06-26 DIAGNOSIS — E785 Hyperlipidemia, unspecified: Secondary | ICD-10-CM | POA: Diagnosis not present

## 2022-07-12 DIAGNOSIS — Z23 Encounter for immunization: Secondary | ICD-10-CM | POA: Diagnosis not present

## 2022-07-14 DIAGNOSIS — H04123 Dry eye syndrome of bilateral lacrimal glands: Secondary | ICD-10-CM | POA: Diagnosis not present

## 2022-07-14 DIAGNOSIS — H2703 Aphakia, bilateral: Secondary | ICD-10-CM | POA: Diagnosis not present

## 2022-07-20 DIAGNOSIS — I358 Other nonrheumatic aortic valve disorders: Secondary | ICD-10-CM | POA: Diagnosis not present

## 2022-07-20 DIAGNOSIS — Z7901 Long term (current) use of anticoagulants: Secondary | ICD-10-CM | POA: Diagnosis not present

## 2022-07-20 DIAGNOSIS — R42 Dizziness and giddiness: Secondary | ICD-10-CM | POA: Diagnosis not present

## 2022-07-20 DIAGNOSIS — I159 Secondary hypertension, unspecified: Secondary | ICD-10-CM | POA: Diagnosis not present

## 2022-07-20 DIAGNOSIS — I4821 Permanent atrial fibrillation: Secondary | ICD-10-CM | POA: Diagnosis not present

## 2022-07-20 DIAGNOSIS — I729 Aneurysm of unspecified site: Secondary | ICD-10-CM | POA: Diagnosis not present

## 2022-07-26 DIAGNOSIS — I129 Hypertensive chronic kidney disease with stage 1 through stage 4 chronic kidney disease, or unspecified chronic kidney disease: Secondary | ICD-10-CM | POA: Diagnosis not present

## 2022-07-26 DIAGNOSIS — J441 Chronic obstructive pulmonary disease with (acute) exacerbation: Secondary | ICD-10-CM | POA: Diagnosis not present

## 2022-07-27 DIAGNOSIS — E785 Hyperlipidemia, unspecified: Secondary | ICD-10-CM | POA: Diagnosis not present

## 2022-07-27 DIAGNOSIS — I129 Hypertensive chronic kidney disease with stage 1 through stage 4 chronic kidney disease, or unspecified chronic kidney disease: Secondary | ICD-10-CM | POA: Diagnosis not present

## 2022-08-23 DIAGNOSIS — Z7901 Long term (current) use of anticoagulants: Secondary | ICD-10-CM | POA: Diagnosis not present

## 2022-08-23 DIAGNOSIS — I4821 Permanent atrial fibrillation: Secondary | ICD-10-CM | POA: Diagnosis not present

## 2022-08-23 DIAGNOSIS — I159 Secondary hypertension, unspecified: Secondary | ICD-10-CM | POA: Diagnosis not present

## 2022-08-23 DIAGNOSIS — I358 Other nonrheumatic aortic valve disorders: Secondary | ICD-10-CM | POA: Diagnosis not present

## 2022-08-25 DIAGNOSIS — I129 Hypertensive chronic kidney disease with stage 1 through stage 4 chronic kidney disease, or unspecified chronic kidney disease: Secondary | ICD-10-CM | POA: Diagnosis not present

## 2022-08-25 DIAGNOSIS — J441 Chronic obstructive pulmonary disease with (acute) exacerbation: Secondary | ICD-10-CM | POA: Diagnosis not present

## 2022-08-26 DIAGNOSIS — I129 Hypertensive chronic kidney disease with stage 1 through stage 4 chronic kidney disease, or unspecified chronic kidney disease: Secondary | ICD-10-CM | POA: Diagnosis not present

## 2022-08-26 DIAGNOSIS — E785 Hyperlipidemia, unspecified: Secondary | ICD-10-CM | POA: Diagnosis not present

## 2022-09-16 DIAGNOSIS — Z20822 Contact with and (suspected) exposure to covid-19: Secondary | ICD-10-CM | POA: Diagnosis not present

## 2022-09-16 DIAGNOSIS — J069 Acute upper respiratory infection, unspecified: Secondary | ICD-10-CM | POA: Diagnosis not present

## 2022-09-26 DIAGNOSIS — I129 Hypertensive chronic kidney disease with stage 1 through stage 4 chronic kidney disease, or unspecified chronic kidney disease: Secondary | ICD-10-CM | POA: Diagnosis not present

## 2022-09-26 DIAGNOSIS — E785 Hyperlipidemia, unspecified: Secondary | ICD-10-CM | POA: Diagnosis not present

## 2022-10-18 DIAGNOSIS — M7061 Trochanteric bursitis, right hip: Secondary | ICD-10-CM | POA: Diagnosis not present

## 2022-10-26 DIAGNOSIS — I4821 Permanent atrial fibrillation: Secondary | ICD-10-CM | POA: Diagnosis not present

## 2022-10-26 DIAGNOSIS — I493 Ventricular premature depolarization: Secondary | ICD-10-CM | POA: Diagnosis not present

## 2022-10-26 DIAGNOSIS — Z8679 Personal history of other diseases of the circulatory system: Secondary | ICD-10-CM | POA: Diagnosis not present

## 2022-10-26 DIAGNOSIS — I10 Essential (primary) hypertension: Secondary | ICD-10-CM | POA: Diagnosis not present

## 2022-10-26 DIAGNOSIS — Z7901 Long term (current) use of anticoagulants: Secondary | ICD-10-CM | POA: Diagnosis not present

## 2022-10-26 DIAGNOSIS — R42 Dizziness and giddiness: Secondary | ICD-10-CM | POA: Diagnosis not present

## 2022-10-27 DIAGNOSIS — E785 Hyperlipidemia, unspecified: Secondary | ICD-10-CM | POA: Diagnosis not present

## 2022-10-27 DIAGNOSIS — I129 Hypertensive chronic kidney disease with stage 1 through stage 4 chronic kidney disease, or unspecified chronic kidney disease: Secondary | ICD-10-CM | POA: Diagnosis not present

## 2022-10-28 IMAGING — MG MM DIGITAL SCREENING BILAT W/ TOMO AND CAD
6 of 10 series · 6 of 30 positions shown · non-contrast
Comparison: Previous exam(s).

CLINICAL DATA: Screening.

EXAM:
DIGITAL SCREENING BILATERAL MAMMOGRAM WITH TOMOSYNTHESIS AND CAD
TECHNIQUE: Bilateral screening digital craniocaudal and mediolateral oblique
mammograms were obtained. Bilateral screening digital breast
tomosynthesis was performed. The images were evaluated with
computer-aided detection.

[L MLO synth-2D]
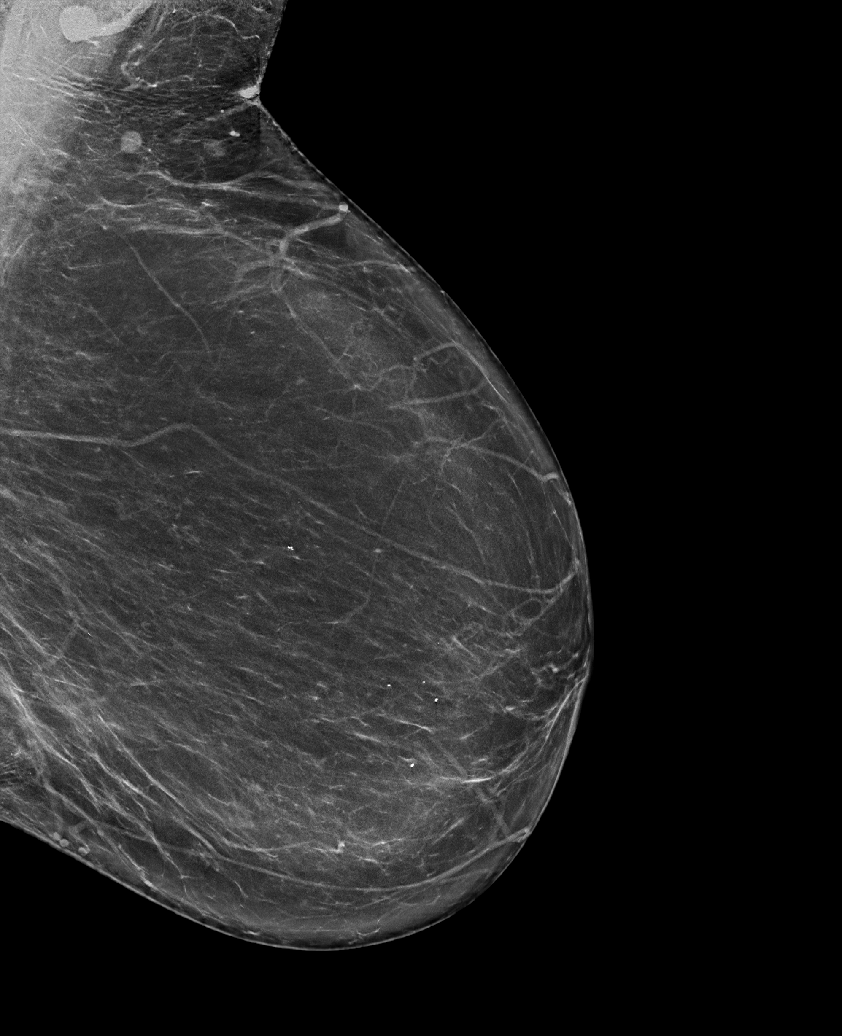

[R CC synth-2D]
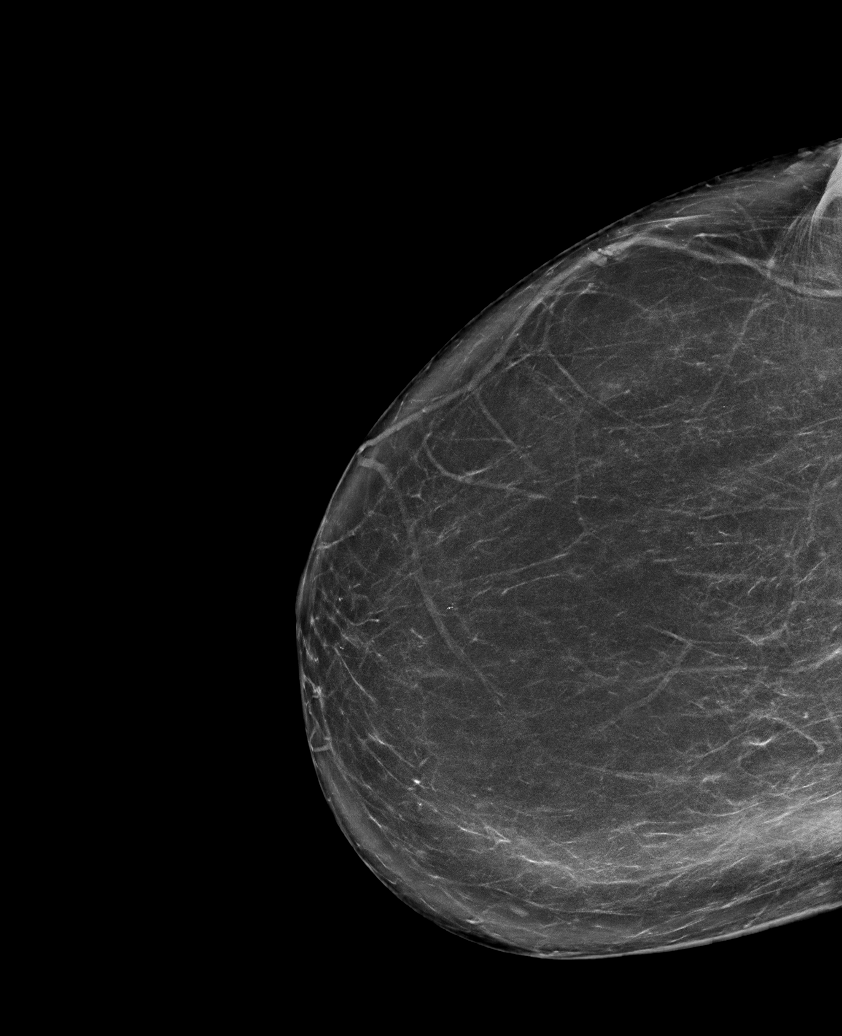

[L CC synth-2D]
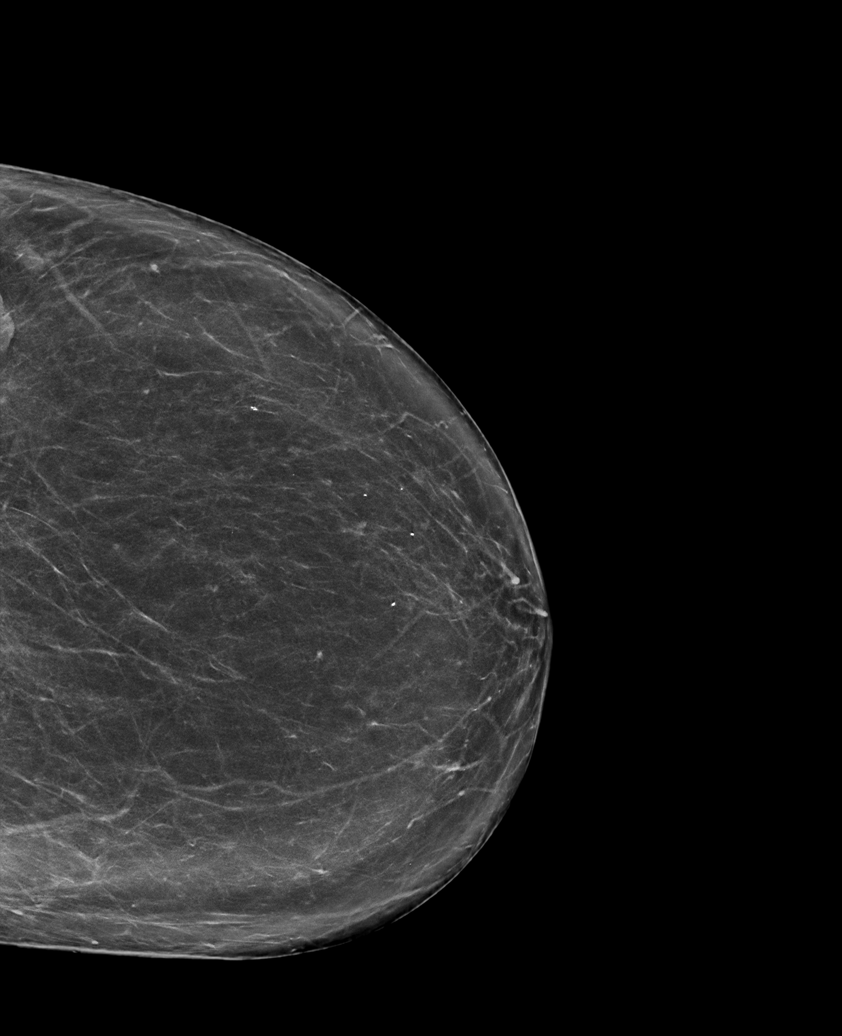

[R MLO synth-2D (1 of 2)]
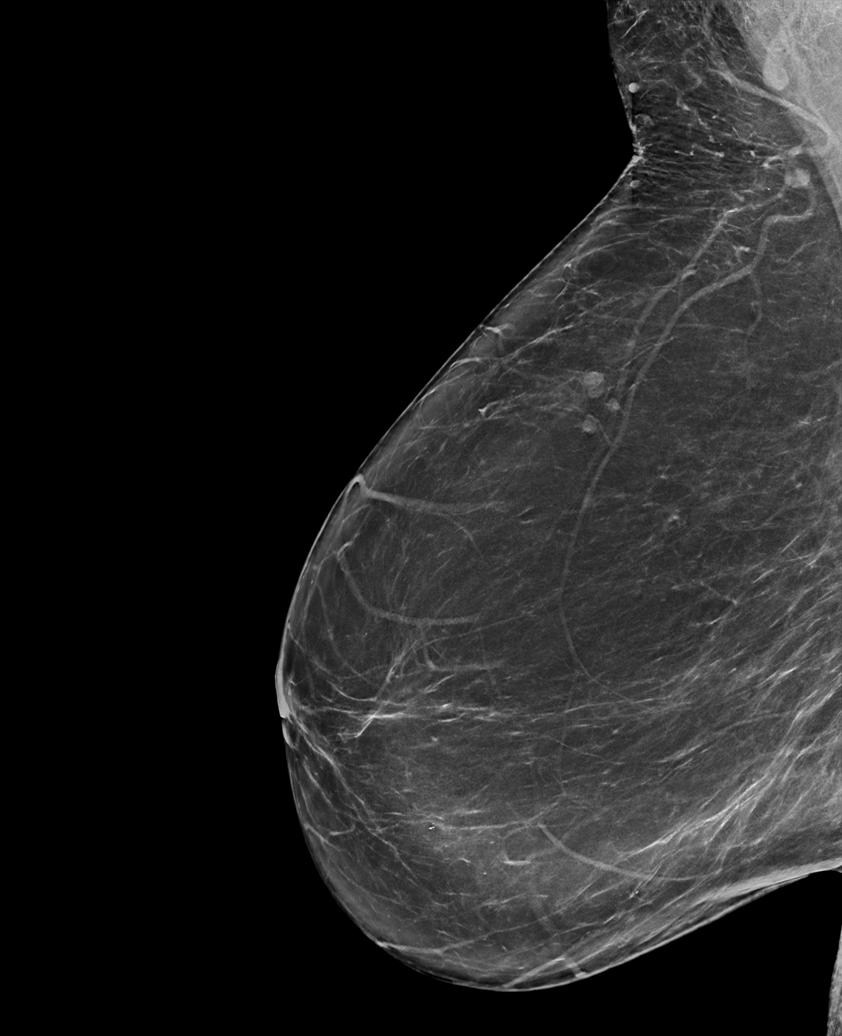

[R MLO synth-2D (2 of 2)]
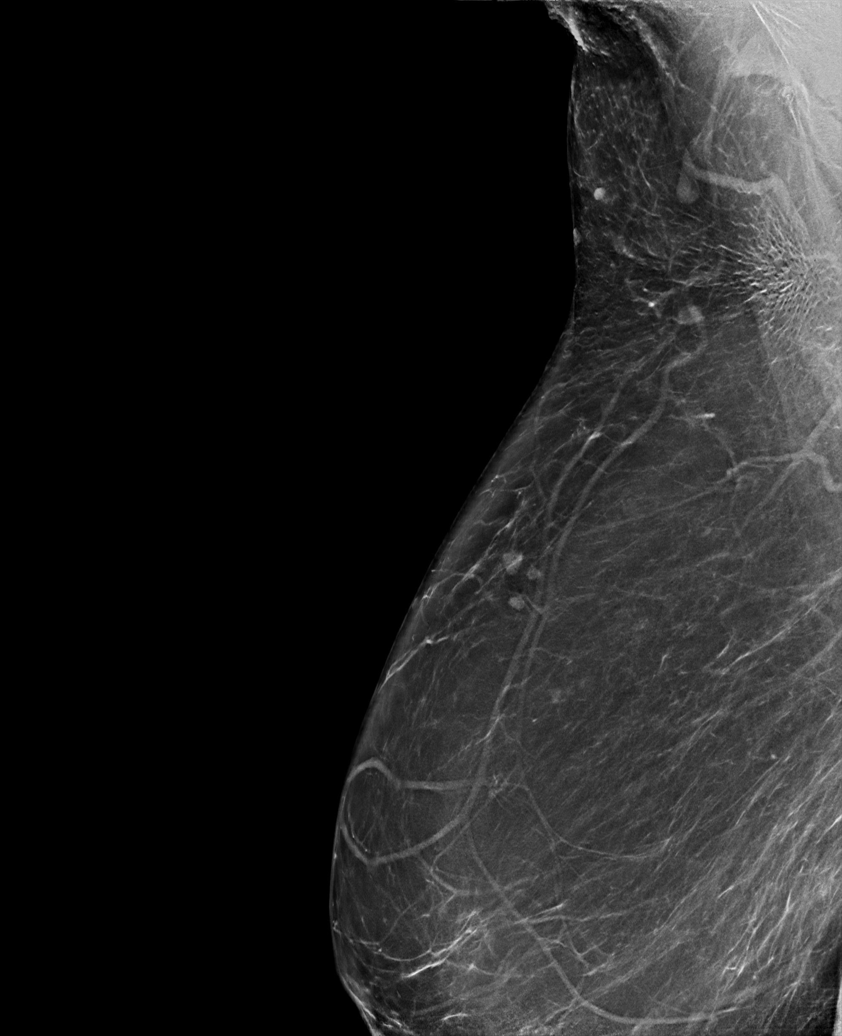

[R MLO tomo · tomo slice 45/89.0]
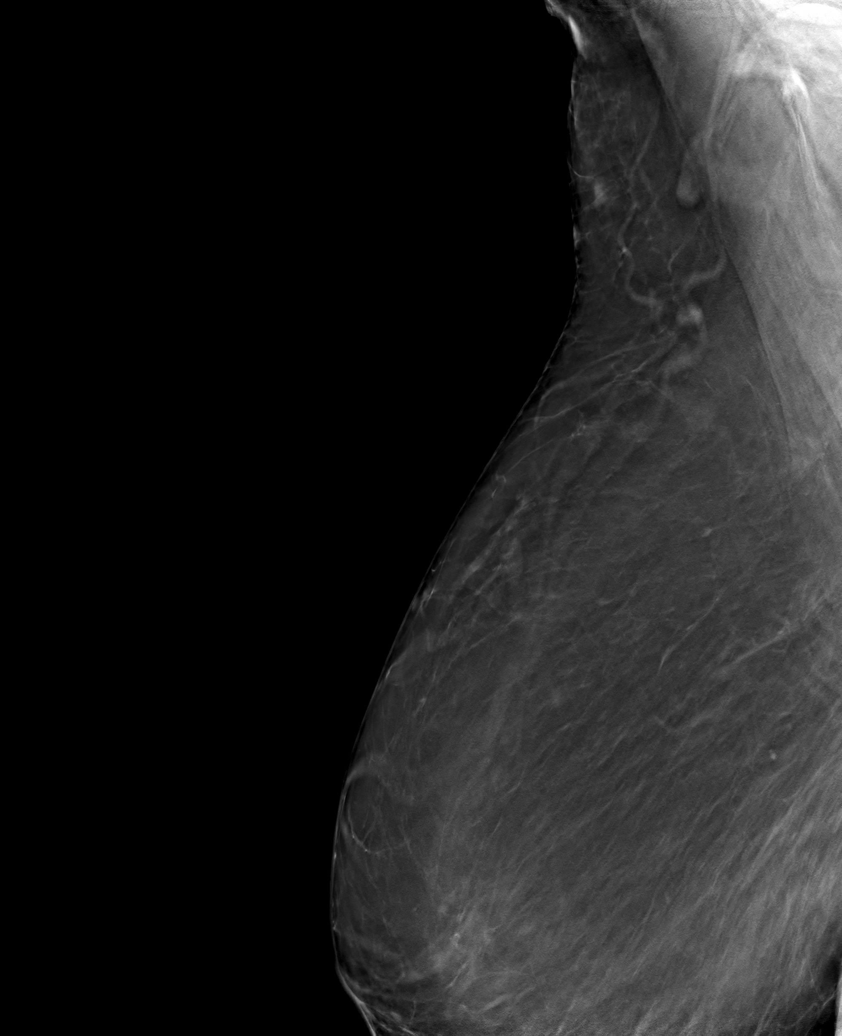

[6 of 30 positions shown; findings below may reference images not displayed]

ACR Breast Density Category b: There are scattered areas of
fibroglandular density.
FINDINGS: There are no findings suspicious for malignancy.
IMPRESSION: No mammographic evidence of malignancy. A result letter of this
screening mammogram will be mailed directly to the patient.

RECOMMENDATION:
Screening mammogram in one year. (Code:51-O-LD2)

BI-RADS CATEGORY  1: Negative.

## 2022-11-09 DIAGNOSIS — R42 Dizziness and giddiness: Secondary | ICD-10-CM | POA: Diagnosis not present

## 2022-11-09 DIAGNOSIS — I4821 Permanent atrial fibrillation: Secondary | ICD-10-CM | POA: Diagnosis not present

## 2022-11-09 DIAGNOSIS — I10 Essential (primary) hypertension: Secondary | ICD-10-CM | POA: Diagnosis not present

## 2022-11-09 DIAGNOSIS — Z7901 Long term (current) use of anticoagulants: Secondary | ICD-10-CM | POA: Diagnosis not present

## 2022-11-24 DIAGNOSIS — J441 Chronic obstructive pulmonary disease with (acute) exacerbation: Secondary | ICD-10-CM | POA: Diagnosis not present

## 2022-11-24 DIAGNOSIS — I129 Hypertensive chronic kidney disease with stage 1 through stage 4 chronic kidney disease, or unspecified chronic kidney disease: Secondary | ICD-10-CM | POA: Diagnosis not present

## 2022-11-25 DIAGNOSIS — E785 Hyperlipidemia, unspecified: Secondary | ICD-10-CM | POA: Diagnosis not present

## 2022-11-25 DIAGNOSIS — I10 Essential (primary) hypertension: Secondary | ICD-10-CM | POA: Diagnosis not present

## 2022-11-25 DIAGNOSIS — I129 Hypertensive chronic kidney disease with stage 1 through stage 4 chronic kidney disease, or unspecified chronic kidney disease: Secondary | ICD-10-CM | POA: Diagnosis not present

## 2022-11-25 DIAGNOSIS — Z7901 Long term (current) use of anticoagulants: Secondary | ICD-10-CM | POA: Diagnosis not present

## 2022-11-25 DIAGNOSIS — R42 Dizziness and giddiness: Secondary | ICD-10-CM | POA: Diagnosis not present

## 2022-11-25 DIAGNOSIS — I4821 Permanent atrial fibrillation: Secondary | ICD-10-CM | POA: Diagnosis not present

## 2022-11-25 DIAGNOSIS — I358 Other nonrheumatic aortic valve disorders: Secondary | ICD-10-CM | POA: Diagnosis not present

## 2022-11-25 DIAGNOSIS — I729 Aneurysm of unspecified site: Secondary | ICD-10-CM | POA: Diagnosis not present

## 2022-11-29 DIAGNOSIS — I129 Hypertensive chronic kidney disease with stage 1 through stage 4 chronic kidney disease, or unspecified chronic kidney disease: Secondary | ICD-10-CM | POA: Diagnosis not present

## 2022-11-29 DIAGNOSIS — E785 Hyperlipidemia, unspecified: Secondary | ICD-10-CM | POA: Diagnosis not present

## 2022-11-29 DIAGNOSIS — R7303 Prediabetes: Secondary | ICD-10-CM | POA: Diagnosis not present

## 2022-12-20 DIAGNOSIS — I951 Orthostatic hypotension: Secondary | ICD-10-CM | POA: Diagnosis not present

## 2022-12-20 DIAGNOSIS — I129 Hypertensive chronic kidney disease with stage 1 through stage 4 chronic kidney disease, or unspecified chronic kidney disease: Secondary | ICD-10-CM | POA: Diagnosis not present

## 2022-12-20 DIAGNOSIS — E785 Hyperlipidemia, unspecified: Secondary | ICD-10-CM | POA: Diagnosis not present

## 2022-12-20 DIAGNOSIS — Z6828 Body mass index (BMI) 28.0-28.9, adult: Secondary | ICD-10-CM | POA: Diagnosis not present

## 2022-12-20 DIAGNOSIS — R7303 Prediabetes: Secondary | ICD-10-CM | POA: Diagnosis not present

## 2022-12-20 DIAGNOSIS — N182 Chronic kidney disease, stage 2 (mild): Secondary | ICD-10-CM | POA: Diagnosis not present

## 2022-12-25 DIAGNOSIS — J441 Chronic obstructive pulmonary disease with (acute) exacerbation: Secondary | ICD-10-CM | POA: Diagnosis not present

## 2022-12-25 DIAGNOSIS — I129 Hypertensive chronic kidney disease with stage 1 through stage 4 chronic kidney disease, or unspecified chronic kidney disease: Secondary | ICD-10-CM | POA: Diagnosis not present

## 2022-12-26 DIAGNOSIS — I129 Hypertensive chronic kidney disease with stage 1 through stage 4 chronic kidney disease, or unspecified chronic kidney disease: Secondary | ICD-10-CM | POA: Diagnosis not present

## 2022-12-26 DIAGNOSIS — D692 Other nonthrombocytopenic purpura: Secondary | ICD-10-CM | POA: Diagnosis not present

## 2022-12-29 DIAGNOSIS — R42 Dizziness and giddiness: Secondary | ICD-10-CM | POA: Diagnosis not present

## 2022-12-29 DIAGNOSIS — I10 Essential (primary) hypertension: Secondary | ICD-10-CM | POA: Diagnosis not present

## 2022-12-29 DIAGNOSIS — R7989 Other specified abnormal findings of blood chemistry: Secondary | ICD-10-CM | POA: Diagnosis not present

## 2022-12-29 DIAGNOSIS — Z79899 Other long term (current) drug therapy: Secondary | ICD-10-CM | POA: Diagnosis not present

## 2022-12-29 DIAGNOSIS — N182 Chronic kidney disease, stage 2 (mild): Secondary | ICD-10-CM | POA: Diagnosis not present

## 2022-12-29 DIAGNOSIS — R7303 Prediabetes: Secondary | ICD-10-CM | POA: Diagnosis not present

## 2022-12-29 DIAGNOSIS — I129 Hypertensive chronic kidney disease with stage 1 through stage 4 chronic kidney disease, or unspecified chronic kidney disease: Secondary | ICD-10-CM | POA: Diagnosis not present

## 2022-12-29 DIAGNOSIS — I4821 Permanent atrial fibrillation: Secondary | ICD-10-CM | POA: Diagnosis not present

## 2022-12-29 DIAGNOSIS — E785 Hyperlipidemia, unspecified: Secondary | ICD-10-CM | POA: Diagnosis not present

## 2022-12-29 DIAGNOSIS — Z6828 Body mass index (BMI) 28.0-28.9, adult: Secondary | ICD-10-CM | POA: Diagnosis not present

## 2023-01-03 DIAGNOSIS — I4821 Permanent atrial fibrillation: Secondary | ICD-10-CM | POA: Diagnosis not present

## 2023-01-03 DIAGNOSIS — I4891 Unspecified atrial fibrillation: Secondary | ICD-10-CM | POA: Diagnosis not present

## 2023-01-23 ENCOUNTER — Other Ambulatory Visit: Payer: Self-pay | Admitting: Family Medicine

## 2023-01-23 DIAGNOSIS — Z1231 Encounter for screening mammogram for malignant neoplasm of breast: Secondary | ICD-10-CM

## 2023-01-24 DIAGNOSIS — J441 Chronic obstructive pulmonary disease with (acute) exacerbation: Secondary | ICD-10-CM | POA: Diagnosis not present

## 2023-01-24 DIAGNOSIS — I129 Hypertensive chronic kidney disease with stage 1 through stage 4 chronic kidney disease, or unspecified chronic kidney disease: Secondary | ICD-10-CM | POA: Diagnosis not present

## 2023-01-25 DIAGNOSIS — E785 Hyperlipidemia, unspecified: Secondary | ICD-10-CM | POA: Diagnosis not present

## 2023-01-25 DIAGNOSIS — I129 Hypertensive chronic kidney disease with stage 1 through stage 4 chronic kidney disease, or unspecified chronic kidney disease: Secondary | ICD-10-CM | POA: Diagnosis not present

## 2023-01-31 DIAGNOSIS — I10 Essential (primary) hypertension: Secondary | ICD-10-CM | POA: Diagnosis not present

## 2023-01-31 DIAGNOSIS — Z7901 Long term (current) use of anticoagulants: Secondary | ICD-10-CM | POA: Diagnosis not present

## 2023-01-31 DIAGNOSIS — I358 Other nonrheumatic aortic valve disorders: Secondary | ICD-10-CM | POA: Diagnosis not present

## 2023-01-31 DIAGNOSIS — I4821 Permanent atrial fibrillation: Secondary | ICD-10-CM | POA: Diagnosis not present

## 2023-02-14 DIAGNOSIS — R001 Bradycardia, unspecified: Secondary | ICD-10-CM | POA: Diagnosis not present

## 2023-02-14 DIAGNOSIS — Z79899 Other long term (current) drug therapy: Secondary | ICD-10-CM | POA: Diagnosis not present

## 2023-02-14 DIAGNOSIS — I4891 Unspecified atrial fibrillation: Secondary | ICD-10-CM | POA: Diagnosis not present

## 2023-02-14 DIAGNOSIS — I440 Atrioventricular block, first degree: Secondary | ICD-10-CM | POA: Diagnosis not present

## 2023-02-14 DIAGNOSIS — I10 Essential (primary) hypertension: Secondary | ICD-10-CM | POA: Diagnosis not present

## 2023-02-14 DIAGNOSIS — I4821 Permanent atrial fibrillation: Secondary | ICD-10-CM | POA: Diagnosis not present

## 2023-02-14 DIAGNOSIS — Z7901 Long term (current) use of anticoagulants: Secondary | ICD-10-CM | POA: Diagnosis not present

## 2023-02-15 DIAGNOSIS — I440 Atrioventricular block, first degree: Secondary | ICD-10-CM | POA: Diagnosis not present

## 2023-02-15 DIAGNOSIS — I499 Cardiac arrhythmia, unspecified: Secondary | ICD-10-CM | POA: Diagnosis not present

## 2023-02-16 DIAGNOSIS — H2703 Aphakia, bilateral: Secondary | ICD-10-CM | POA: Diagnosis not present

## 2023-02-16 DIAGNOSIS — H04123 Dry eye syndrome of bilateral lacrimal glands: Secondary | ICD-10-CM | POA: Diagnosis not present

## 2023-02-21 DIAGNOSIS — I10 Essential (primary) hypertension: Secondary | ICD-10-CM | POA: Diagnosis not present

## 2023-02-21 DIAGNOSIS — R519 Headache, unspecified: Secondary | ICD-10-CM | POA: Diagnosis not present

## 2023-02-21 DIAGNOSIS — R531 Weakness: Secondary | ICD-10-CM | POA: Diagnosis not present

## 2023-02-21 DIAGNOSIS — R931 Abnormal findings on diagnostic imaging of heart and coronary circulation: Secondary | ICD-10-CM | POA: Diagnosis not present

## 2023-02-21 DIAGNOSIS — R918 Other nonspecific abnormal finding of lung field: Secondary | ICD-10-CM | POA: Diagnosis not present

## 2023-02-21 DIAGNOSIS — M542 Cervicalgia: Secondary | ICD-10-CM | POA: Diagnosis not present

## 2023-02-21 DIAGNOSIS — R700 Elevated erythrocyte sedimentation rate: Secondary | ICD-10-CM | POA: Diagnosis not present

## 2023-02-23 DIAGNOSIS — Z6828 Body mass index (BMI) 28.0-28.9, adult: Secondary | ICD-10-CM | POA: Diagnosis not present

## 2023-02-23 DIAGNOSIS — R700 Elevated erythrocyte sedimentation rate: Secondary | ICD-10-CM | POA: Diagnosis not present

## 2023-02-23 DIAGNOSIS — R9389 Abnormal findings on diagnostic imaging of other specified body structures: Secondary | ICD-10-CM | POA: Diagnosis not present

## 2023-02-23 DIAGNOSIS — R519 Headache, unspecified: Secondary | ICD-10-CM | POA: Diagnosis not present

## 2023-02-23 DIAGNOSIS — Z7689 Persons encountering health services in other specified circumstances: Secondary | ICD-10-CM | POA: Diagnosis not present

## 2023-02-24 DIAGNOSIS — J441 Chronic obstructive pulmonary disease with (acute) exacerbation: Secondary | ICD-10-CM | POA: Diagnosis not present

## 2023-02-24 DIAGNOSIS — I129 Hypertensive chronic kidney disease with stage 1 through stage 4 chronic kidney disease, or unspecified chronic kidney disease: Secondary | ICD-10-CM | POA: Diagnosis not present

## 2023-02-25 DIAGNOSIS — E785 Hyperlipidemia, unspecified: Secondary | ICD-10-CM | POA: Diagnosis not present

## 2023-02-25 DIAGNOSIS — I129 Hypertensive chronic kidney disease with stage 1 through stage 4 chronic kidney disease, or unspecified chronic kidney disease: Secondary | ICD-10-CM | POA: Diagnosis not present

## 2023-02-28 DIAGNOSIS — M7989 Other specified soft tissue disorders: Secondary | ICD-10-CM | POA: Diagnosis not present

## 2023-02-28 DIAGNOSIS — I358 Other nonrheumatic aortic valve disorders: Secondary | ICD-10-CM | POA: Diagnosis not present

## 2023-02-28 DIAGNOSIS — R9389 Abnormal findings on diagnostic imaging of other specified body structures: Secondary | ICD-10-CM | POA: Diagnosis not present

## 2023-03-07 DIAGNOSIS — M316 Other giant cell arteritis: Secondary | ICD-10-CM | POA: Diagnosis not present

## 2023-03-09 DIAGNOSIS — Z1339 Encounter for screening examination for other mental health and behavioral disorders: Secondary | ICD-10-CM | POA: Diagnosis not present

## 2023-03-09 DIAGNOSIS — Z6827 Body mass index (BMI) 27.0-27.9, adult: Secondary | ICD-10-CM | POA: Diagnosis not present

## 2023-03-09 DIAGNOSIS — Z0000 Encounter for general adult medical examination without abnormal findings: Secondary | ICD-10-CM | POA: Diagnosis not present

## 2023-03-09 DIAGNOSIS — Z139 Encounter for screening, unspecified: Secondary | ICD-10-CM | POA: Diagnosis not present

## 2023-03-09 DIAGNOSIS — Z136 Encounter for screening for cardiovascular disorders: Secondary | ICD-10-CM | POA: Diagnosis not present

## 2023-03-09 DIAGNOSIS — Z1331 Encounter for screening for depression: Secondary | ICD-10-CM | POA: Diagnosis not present

## 2023-03-09 DIAGNOSIS — R519 Headache, unspecified: Secondary | ICD-10-CM | POA: Diagnosis not present

## 2023-03-14 DIAGNOSIS — M316 Other giant cell arteritis: Secondary | ICD-10-CM | POA: Diagnosis not present

## 2023-03-26 DIAGNOSIS — J441 Chronic obstructive pulmonary disease with (acute) exacerbation: Secondary | ICD-10-CM | POA: Diagnosis not present

## 2023-03-26 DIAGNOSIS — I129 Hypertensive chronic kidney disease with stage 1 through stage 4 chronic kidney disease, or unspecified chronic kidney disease: Secondary | ICD-10-CM | POA: Diagnosis not present

## 2023-03-27 DIAGNOSIS — E785 Hyperlipidemia, unspecified: Secondary | ICD-10-CM | POA: Diagnosis not present

## 2023-03-27 DIAGNOSIS — I129 Hypertensive chronic kidney disease with stage 1 through stage 4 chronic kidney disease, or unspecified chronic kidney disease: Secondary | ICD-10-CM | POA: Diagnosis not present

## 2023-04-11 DIAGNOSIS — I4821 Permanent atrial fibrillation: Secondary | ICD-10-CM | POA: Diagnosis not present

## 2023-04-11 DIAGNOSIS — Z7901 Long term (current) use of anticoagulants: Secondary | ICD-10-CM | POA: Diagnosis not present

## 2023-04-11 DIAGNOSIS — I10 Essential (primary) hypertension: Secondary | ICD-10-CM | POA: Diagnosis not present

## 2023-04-11 DIAGNOSIS — I729 Aneurysm of unspecified site: Secondary | ICD-10-CM | POA: Diagnosis not present

## 2023-04-12 ENCOUNTER — Inpatient Hospital Stay: Admission: RE | Admit: 2023-04-12 | Payer: Medicare HMO | Source: Ambulatory Visit

## 2023-04-12 DIAGNOSIS — Z1231 Encounter for screening mammogram for malignant neoplasm of breast: Secondary | ICD-10-CM | POA: Diagnosis not present

## 2023-04-26 DIAGNOSIS — J441 Chronic obstructive pulmonary disease with (acute) exacerbation: Secondary | ICD-10-CM | POA: Diagnosis not present

## 2023-04-26 DIAGNOSIS — I129 Hypertensive chronic kidney disease with stage 1 through stage 4 chronic kidney disease, or unspecified chronic kidney disease: Secondary | ICD-10-CM | POA: Diagnosis not present

## 2023-04-27 DIAGNOSIS — E785 Hyperlipidemia, unspecified: Secondary | ICD-10-CM | POA: Diagnosis not present

## 2023-04-27 DIAGNOSIS — I129 Hypertensive chronic kidney disease with stage 1 through stage 4 chronic kidney disease, or unspecified chronic kidney disease: Secondary | ICD-10-CM | POA: Diagnosis not present

## 2023-05-09 DIAGNOSIS — E785 Hyperlipidemia, unspecified: Secondary | ICD-10-CM | POA: Diagnosis not present

## 2023-05-09 DIAGNOSIS — R7303 Prediabetes: Secondary | ICD-10-CM | POA: Diagnosis not present

## 2023-05-09 DIAGNOSIS — I129 Hypertensive chronic kidney disease with stage 1 through stage 4 chronic kidney disease, or unspecified chronic kidney disease: Secondary | ICD-10-CM | POA: Diagnosis not present

## 2023-05-16 DIAGNOSIS — Z139 Encounter for screening, unspecified: Secondary | ICD-10-CM | POA: Diagnosis not present

## 2023-05-16 DIAGNOSIS — J449 Chronic obstructive pulmonary disease, unspecified: Secondary | ICD-10-CM | POA: Diagnosis not present

## 2023-05-16 DIAGNOSIS — I129 Hypertensive chronic kidney disease with stage 1 through stage 4 chronic kidney disease, or unspecified chronic kidney disease: Secondary | ICD-10-CM | POA: Diagnosis not present

## 2023-05-16 DIAGNOSIS — E785 Hyperlipidemia, unspecified: Secondary | ICD-10-CM | POA: Diagnosis not present

## 2023-05-16 DIAGNOSIS — Z6827 Body mass index (BMI) 27.0-27.9, adult: Secondary | ICD-10-CM | POA: Diagnosis not present

## 2023-05-16 DIAGNOSIS — N182 Chronic kidney disease, stage 2 (mild): Secondary | ICD-10-CM | POA: Diagnosis not present

## 2023-05-18 DIAGNOSIS — D225 Melanocytic nevi of trunk: Secondary | ICD-10-CM | POA: Diagnosis not present

## 2023-05-18 DIAGNOSIS — D2239 Melanocytic nevi of other parts of face: Secondary | ICD-10-CM | POA: Diagnosis not present

## 2023-05-18 DIAGNOSIS — L814 Other melanin hyperpigmentation: Secondary | ICD-10-CM | POA: Diagnosis not present

## 2023-05-18 DIAGNOSIS — L821 Other seborrheic keratosis: Secondary | ICD-10-CM | POA: Diagnosis not present

## 2023-05-24 DIAGNOSIS — I729 Aneurysm of unspecified site: Secondary | ICD-10-CM | POA: Diagnosis not present

## 2023-05-24 DIAGNOSIS — Z8679 Personal history of other diseases of the circulatory system: Secondary | ICD-10-CM | POA: Diagnosis not present

## 2023-05-24 DIAGNOSIS — I4821 Permanent atrial fibrillation: Secondary | ICD-10-CM | POA: Diagnosis not present

## 2023-05-24 DIAGNOSIS — I358 Other nonrheumatic aortic valve disorders: Secondary | ICD-10-CM | POA: Diagnosis not present

## 2023-05-24 DIAGNOSIS — I10 Essential (primary) hypertension: Secondary | ICD-10-CM | POA: Diagnosis not present

## 2023-05-27 DIAGNOSIS — J441 Chronic obstructive pulmonary disease with (acute) exacerbation: Secondary | ICD-10-CM | POA: Diagnosis not present

## 2023-05-27 DIAGNOSIS — I129 Hypertensive chronic kidney disease with stage 1 through stage 4 chronic kidney disease, or unspecified chronic kidney disease: Secondary | ICD-10-CM | POA: Diagnosis not present

## 2023-05-28 DIAGNOSIS — I129 Hypertensive chronic kidney disease with stage 1 through stage 4 chronic kidney disease, or unspecified chronic kidney disease: Secondary | ICD-10-CM | POA: Diagnosis not present

## 2023-05-28 DIAGNOSIS — E785 Hyperlipidemia, unspecified: Secondary | ICD-10-CM | POA: Diagnosis not present

## 2023-06-20 DIAGNOSIS — I4821 Permanent atrial fibrillation: Secondary | ICD-10-CM | POA: Diagnosis not present

## 2023-06-20 DIAGNOSIS — R42 Dizziness and giddiness: Secondary | ICD-10-CM | POA: Diagnosis not present

## 2023-06-27 DIAGNOSIS — I129 Hypertensive chronic kidney disease with stage 1 through stage 4 chronic kidney disease, or unspecified chronic kidney disease: Secondary | ICD-10-CM | POA: Diagnosis not present

## 2023-06-27 DIAGNOSIS — E785 Hyperlipidemia, unspecified: Secondary | ICD-10-CM | POA: Diagnosis not present

## 2023-06-30 DIAGNOSIS — R42 Dizziness and giddiness: Secondary | ICD-10-CM | POA: Diagnosis not present

## 2023-06-30 DIAGNOSIS — I4821 Permanent atrial fibrillation: Secondary | ICD-10-CM | POA: Diagnosis not present

## 2023-07-07 DIAGNOSIS — E663 Overweight: Secondary | ICD-10-CM | POA: Diagnosis not present

## 2023-07-07 DIAGNOSIS — Z6827 Body mass index (BMI) 27.0-27.9, adult: Secondary | ICD-10-CM | POA: Diagnosis not present

## 2023-07-07 DIAGNOSIS — R339 Retention of urine, unspecified: Secondary | ICD-10-CM | POA: Diagnosis not present

## 2023-07-07 DIAGNOSIS — N390 Urinary tract infection, site not specified: Secondary | ICD-10-CM | POA: Diagnosis not present

## 2023-07-13 DIAGNOSIS — Z6827 Body mass index (BMI) 27.0-27.9, adult: Secondary | ICD-10-CM | POA: Diagnosis not present

## 2023-07-13 DIAGNOSIS — Z20822 Contact with and (suspected) exposure to covid-19: Secondary | ICD-10-CM | POA: Diagnosis not present

## 2023-07-13 DIAGNOSIS — R059 Cough, unspecified: Secondary | ICD-10-CM | POA: Diagnosis not present

## 2023-07-13 DIAGNOSIS — B338 Other specified viral diseases: Secondary | ICD-10-CM | POA: Diagnosis not present

## 2023-07-28 DIAGNOSIS — E785 Hyperlipidemia, unspecified: Secondary | ICD-10-CM | POA: Diagnosis not present

## 2023-07-28 DIAGNOSIS — I129 Hypertensive chronic kidney disease with stage 1 through stage 4 chronic kidney disease, or unspecified chronic kidney disease: Secondary | ICD-10-CM | POA: Diagnosis not present

## 2023-08-17 DIAGNOSIS — I4821 Permanent atrial fibrillation: Secondary | ICD-10-CM | POA: Diagnosis not present

## 2023-08-17 DIAGNOSIS — I4891 Unspecified atrial fibrillation: Secondary | ICD-10-CM | POA: Diagnosis not present

## 2023-08-17 DIAGNOSIS — I358 Other nonrheumatic aortic valve disorders: Secondary | ICD-10-CM | POA: Diagnosis not present

## 2023-08-17 DIAGNOSIS — I729 Aneurysm of unspecified site: Secondary | ICD-10-CM | POA: Diagnosis not present

## 2023-08-17 DIAGNOSIS — I10 Essential (primary) hypertension: Secondary | ICD-10-CM | POA: Diagnosis not present

## 2023-08-17 DIAGNOSIS — Z7901 Long term (current) use of anticoagulants: Secondary | ICD-10-CM | POA: Diagnosis not present

## 2023-08-17 DIAGNOSIS — R7989 Other specified abnormal findings of blood chemistry: Secondary | ICD-10-CM | POA: Diagnosis not present

## 2023-08-27 DIAGNOSIS — E785 Hyperlipidemia, unspecified: Secondary | ICD-10-CM | POA: Diagnosis not present

## 2023-08-27 DIAGNOSIS — I129 Hypertensive chronic kidney disease with stage 1 through stage 4 chronic kidney disease, or unspecified chronic kidney disease: Secondary | ICD-10-CM | POA: Diagnosis not present

## 2023-09-06 DIAGNOSIS — Z20822 Contact with and (suspected) exposure to covid-19: Secondary | ICD-10-CM | POA: Diagnosis not present

## 2023-09-06 DIAGNOSIS — J449 Chronic obstructive pulmonary disease, unspecified: Secondary | ICD-10-CM | POA: Diagnosis not present

## 2023-09-06 DIAGNOSIS — J029 Acute pharyngitis, unspecified: Secondary | ICD-10-CM | POA: Diagnosis not present

## 2023-09-06 DIAGNOSIS — J040 Acute laryngitis: Secondary | ICD-10-CM | POA: Diagnosis not present

## 2023-09-27 DIAGNOSIS — I129 Hypertensive chronic kidney disease with stage 1 through stage 4 chronic kidney disease, or unspecified chronic kidney disease: Secondary | ICD-10-CM | POA: Diagnosis not present

## 2023-09-27 DIAGNOSIS — E785 Hyperlipidemia, unspecified: Secondary | ICD-10-CM | POA: Diagnosis not present

## 2023-10-12 DIAGNOSIS — E785 Hyperlipidemia, unspecified: Secondary | ICD-10-CM | POA: Diagnosis not present

## 2023-10-12 DIAGNOSIS — R7303 Prediabetes: Secondary | ICD-10-CM | POA: Diagnosis not present

## 2023-10-17 DIAGNOSIS — K080 Exfoliation of teeth due to systemic causes: Secondary | ICD-10-CM | POA: Diagnosis not present

## 2023-10-18 DIAGNOSIS — M7062 Trochanteric bursitis, left hip: Secondary | ICD-10-CM | POA: Diagnosis not present

## 2023-10-18 DIAGNOSIS — M7061 Trochanteric bursitis, right hip: Secondary | ICD-10-CM | POA: Diagnosis not present

## 2023-10-24 DIAGNOSIS — R1032 Left lower quadrant pain: Secondary | ICD-10-CM | POA: Diagnosis not present

## 2023-10-24 DIAGNOSIS — I129 Hypertensive chronic kidney disease with stage 1 through stage 4 chronic kidney disease, or unspecified chronic kidney disease: Secondary | ICD-10-CM | POA: Diagnosis not present

## 2023-10-24 DIAGNOSIS — N182 Chronic kidney disease, stage 2 (mild): Secondary | ICD-10-CM | POA: Diagnosis not present

## 2023-10-24 DIAGNOSIS — Z6826 Body mass index (BMI) 26.0-26.9, adult: Secondary | ICD-10-CM | POA: Diagnosis not present

## 2023-10-24 DIAGNOSIS — R7303 Prediabetes: Secondary | ICD-10-CM | POA: Diagnosis not present

## 2023-10-24 DIAGNOSIS — R7989 Other specified abnormal findings of blood chemistry: Secondary | ICD-10-CM | POA: Diagnosis not present

## 2023-10-28 DIAGNOSIS — E785 Hyperlipidemia, unspecified: Secondary | ICD-10-CM | POA: Diagnosis not present

## 2023-10-28 DIAGNOSIS — I129 Hypertensive chronic kidney disease with stage 1 through stage 4 chronic kidney disease, or unspecified chronic kidney disease: Secondary | ICD-10-CM | POA: Diagnosis not present

## 2023-10-30 DIAGNOSIS — R232 Flushing: Secondary | ICD-10-CM | POA: Diagnosis not present

## 2023-10-30 DIAGNOSIS — K219 Gastro-esophageal reflux disease without esophagitis: Secondary | ICD-10-CM | POA: Diagnosis not present

## 2023-10-30 DIAGNOSIS — Z6826 Body mass index (BMI) 26.0-26.9, adult: Secondary | ICD-10-CM | POA: Diagnosis not present

## 2023-10-30 DIAGNOSIS — K5732 Diverticulitis of large intestine without perforation or abscess without bleeding: Secondary | ICD-10-CM | POA: Diagnosis not present

## 2023-11-22 DIAGNOSIS — M25511 Pain in right shoulder: Secondary | ICD-10-CM | POA: Diagnosis not present

## 2023-11-22 DIAGNOSIS — M19011 Primary osteoarthritis, right shoulder: Secondary | ICD-10-CM | POA: Diagnosis not present

## 2023-11-25 DIAGNOSIS — I129 Hypertensive chronic kidney disease with stage 1 through stage 4 chronic kidney disease, or unspecified chronic kidney disease: Secondary | ICD-10-CM | POA: Diagnosis not present

## 2023-11-25 DIAGNOSIS — E785 Hyperlipidemia, unspecified: Secondary | ICD-10-CM | POA: Diagnosis not present

## 2023-11-29 DIAGNOSIS — M7541 Impingement syndrome of right shoulder: Secondary | ICD-10-CM | POA: Diagnosis not present

## 2023-11-29 DIAGNOSIS — M25511 Pain in right shoulder: Secondary | ICD-10-CM | POA: Diagnosis not present

## 2023-11-29 DIAGNOSIS — M6281 Muscle weakness (generalized): Secondary | ICD-10-CM | POA: Diagnosis not present

## 2023-12-11 DIAGNOSIS — I482 Chronic atrial fibrillation, unspecified: Secondary | ICD-10-CM | POA: Diagnosis not present

## 2023-12-11 DIAGNOSIS — I081 Rheumatic disorders of both mitral and tricuspid valves: Secondary | ICD-10-CM | POA: Diagnosis not present

## 2023-12-11 DIAGNOSIS — Z9049 Acquired absence of other specified parts of digestive tract: Secondary | ICD-10-CM | POA: Diagnosis not present

## 2023-12-11 DIAGNOSIS — E871 Hypo-osmolality and hyponatremia: Secondary | ICD-10-CM | POA: Diagnosis not present

## 2023-12-11 DIAGNOSIS — I480 Paroxysmal atrial fibrillation: Secondary | ICD-10-CM | POA: Diagnosis not present

## 2023-12-11 DIAGNOSIS — I4821 Permanent atrial fibrillation: Secondary | ICD-10-CM | POA: Diagnosis not present

## 2023-12-11 DIAGNOSIS — I499 Cardiac arrhythmia, unspecified: Secondary | ICD-10-CM | POA: Diagnosis not present

## 2023-12-11 DIAGNOSIS — Z9842 Cataract extraction status, left eye: Secondary | ICD-10-CM | POA: Diagnosis not present

## 2023-12-11 DIAGNOSIS — I10 Essential (primary) hypertension: Secondary | ICD-10-CM | POA: Diagnosis not present

## 2023-12-11 DIAGNOSIS — R031 Nonspecific low blood-pressure reading: Secondary | ICD-10-CM | POA: Diagnosis not present

## 2023-12-11 DIAGNOSIS — Z961 Presence of intraocular lens: Secondary | ICD-10-CM | POA: Diagnosis not present

## 2023-12-11 DIAGNOSIS — I493 Ventricular premature depolarization: Secondary | ICD-10-CM | POA: Diagnosis not present

## 2023-12-11 DIAGNOSIS — T502X5A Adverse effect of carbonic-anhydrase inhibitors, benzothiadiazides and other diuretics, initial encounter: Secondary | ICD-10-CM | POA: Diagnosis not present

## 2023-12-11 DIAGNOSIS — I517 Cardiomegaly: Secondary | ICD-10-CM | POA: Diagnosis not present

## 2023-12-11 DIAGNOSIS — D369 Benign neoplasm, unspecified site: Secondary | ICD-10-CM | POA: Diagnosis not present

## 2023-12-11 DIAGNOSIS — Z9841 Cataract extraction status, right eye: Secondary | ICD-10-CM | POA: Diagnosis not present

## 2023-12-11 DIAGNOSIS — E785 Hyperlipidemia, unspecified: Secondary | ICD-10-CM | POA: Diagnosis not present

## 2023-12-11 DIAGNOSIS — Z538 Procedure and treatment not carried out for other reasons: Secondary | ICD-10-CM | POA: Diagnosis not present

## 2023-12-11 DIAGNOSIS — J45909 Unspecified asthma, uncomplicated: Secondary | ICD-10-CM | POA: Diagnosis not present

## 2023-12-11 DIAGNOSIS — Z90711 Acquired absence of uterus with remaining cervical stump: Secondary | ICD-10-CM | POA: Diagnosis not present

## 2023-12-11 DIAGNOSIS — I4891 Unspecified atrial fibrillation: Secondary | ICD-10-CM | POA: Diagnosis not present

## 2023-12-11 DIAGNOSIS — Z7901 Long term (current) use of anticoagulants: Secondary | ICD-10-CM | POA: Diagnosis not present

## 2023-12-12 DIAGNOSIS — T502X5A Adverse effect of carbonic-anhydrase inhibitors, benzothiadiazides and other diuretics, initial encounter: Secondary | ICD-10-CM | POA: Diagnosis not present

## 2023-12-12 DIAGNOSIS — R031 Nonspecific low blood-pressure reading: Secondary | ICD-10-CM | POA: Diagnosis not present

## 2023-12-12 DIAGNOSIS — I480 Paroxysmal atrial fibrillation: Secondary | ICD-10-CM | POA: Diagnosis not present

## 2023-12-12 DIAGNOSIS — I4821 Permanent atrial fibrillation: Secondary | ICD-10-CM | POA: Diagnosis not present

## 2023-12-12 DIAGNOSIS — E871 Hypo-osmolality and hyponatremia: Secondary | ICD-10-CM | POA: Diagnosis not present

## 2023-12-15 DIAGNOSIS — I4891 Unspecified atrial fibrillation: Secondary | ICD-10-CM | POA: Diagnosis not present

## 2023-12-18 DIAGNOSIS — I4891 Unspecified atrial fibrillation: Secondary | ICD-10-CM | POA: Diagnosis not present

## 2023-12-18 DIAGNOSIS — I4819 Other persistent atrial fibrillation: Secondary | ICD-10-CM | POA: Diagnosis not present

## 2023-12-18 DIAGNOSIS — I358 Other nonrheumatic aortic valve disorders: Secondary | ICD-10-CM | POA: Diagnosis not present

## 2023-12-18 DIAGNOSIS — R9431 Abnormal electrocardiogram [ECG] [EKG]: Secondary | ICD-10-CM | POA: Diagnosis not present

## 2023-12-18 DIAGNOSIS — I480 Paroxysmal atrial fibrillation: Secondary | ICD-10-CM | POA: Diagnosis not present

## 2023-12-20 DIAGNOSIS — J449 Chronic obstructive pulmonary disease, unspecified: Secondary | ICD-10-CM | POA: Diagnosis not present

## 2023-12-20 DIAGNOSIS — K219 Gastro-esophageal reflux disease without esophagitis: Secondary | ICD-10-CM | POA: Diagnosis not present

## 2023-12-20 DIAGNOSIS — R7303 Prediabetes: Secondary | ICD-10-CM | POA: Diagnosis not present

## 2023-12-20 DIAGNOSIS — E785 Hyperlipidemia, unspecified: Secondary | ICD-10-CM | POA: Diagnosis not present

## 2023-12-20 DIAGNOSIS — Z79899 Other long term (current) drug therapy: Secondary | ICD-10-CM | POA: Diagnosis not present

## 2023-12-20 DIAGNOSIS — I129 Hypertensive chronic kidney disease with stage 1 through stage 4 chronic kidney disease, or unspecified chronic kidney disease: Secondary | ICD-10-CM | POA: Diagnosis not present

## 2023-12-20 DIAGNOSIS — K5730 Diverticulosis of large intestine without perforation or abscess without bleeding: Secondary | ICD-10-CM | POA: Diagnosis not present

## 2023-12-20 DIAGNOSIS — G4733 Obstructive sleep apnea (adult) (pediatric): Secondary | ICD-10-CM | POA: Diagnosis not present

## 2023-12-20 DIAGNOSIS — D6869 Other thrombophilia: Secondary | ICD-10-CM | POA: Diagnosis not present

## 2023-12-20 DIAGNOSIS — I358 Other nonrheumatic aortic valve disorders: Secondary | ICD-10-CM | POA: Diagnosis not present

## 2023-12-20 DIAGNOSIS — E871 Hypo-osmolality and hyponatremia: Secondary | ICD-10-CM | POA: Diagnosis not present

## 2023-12-20 DIAGNOSIS — Z7901 Long term (current) use of anticoagulants: Secondary | ICD-10-CM | POA: Diagnosis not present

## 2023-12-20 DIAGNOSIS — I4819 Other persistent atrial fibrillation: Secondary | ICD-10-CM | POA: Diagnosis not present

## 2023-12-20 DIAGNOSIS — N182 Chronic kidney disease, stage 2 (mild): Secondary | ICD-10-CM | POA: Diagnosis not present

## 2023-12-22 DIAGNOSIS — K5730 Diverticulosis of large intestine without perforation or abscess without bleeding: Secondary | ICD-10-CM | POA: Diagnosis not present

## 2023-12-22 DIAGNOSIS — D6869 Other thrombophilia: Secondary | ICD-10-CM | POA: Diagnosis not present

## 2023-12-22 DIAGNOSIS — E785 Hyperlipidemia, unspecified: Secondary | ICD-10-CM | POA: Diagnosis not present

## 2023-12-22 DIAGNOSIS — I358 Other nonrheumatic aortic valve disorders: Secondary | ICD-10-CM | POA: Diagnosis not present

## 2023-12-22 DIAGNOSIS — K219 Gastro-esophageal reflux disease without esophagitis: Secondary | ICD-10-CM | POA: Diagnosis not present

## 2023-12-22 DIAGNOSIS — G4733 Obstructive sleep apnea (adult) (pediatric): Secondary | ICD-10-CM | POA: Diagnosis not present

## 2023-12-22 DIAGNOSIS — Z7901 Long term (current) use of anticoagulants: Secondary | ICD-10-CM | POA: Diagnosis not present

## 2023-12-22 DIAGNOSIS — Z79899 Other long term (current) drug therapy: Secondary | ICD-10-CM | POA: Diagnosis not present

## 2023-12-22 DIAGNOSIS — R7303 Prediabetes: Secondary | ICD-10-CM | POA: Diagnosis not present

## 2023-12-22 DIAGNOSIS — I129 Hypertensive chronic kidney disease with stage 1 through stage 4 chronic kidney disease, or unspecified chronic kidney disease: Secondary | ICD-10-CM | POA: Diagnosis not present

## 2023-12-22 DIAGNOSIS — N182 Chronic kidney disease, stage 2 (mild): Secondary | ICD-10-CM | POA: Diagnosis not present

## 2023-12-22 DIAGNOSIS — J449 Chronic obstructive pulmonary disease, unspecified: Secondary | ICD-10-CM | POA: Diagnosis not present

## 2023-12-22 DIAGNOSIS — I4819 Other persistent atrial fibrillation: Secondary | ICD-10-CM | POA: Diagnosis not present

## 2023-12-22 DIAGNOSIS — E871 Hypo-osmolality and hyponatremia: Secondary | ICD-10-CM | POA: Diagnosis not present

## 2023-12-25 DIAGNOSIS — N182 Chronic kidney disease, stage 2 (mild): Secondary | ICD-10-CM | POA: Diagnosis not present

## 2023-12-25 DIAGNOSIS — K219 Gastro-esophageal reflux disease without esophagitis: Secondary | ICD-10-CM | POA: Diagnosis not present

## 2023-12-25 DIAGNOSIS — E871 Hypo-osmolality and hyponatremia: Secondary | ICD-10-CM | POA: Diagnosis not present

## 2023-12-25 DIAGNOSIS — I4819 Other persistent atrial fibrillation: Secondary | ICD-10-CM | POA: Diagnosis not present

## 2023-12-25 DIAGNOSIS — J449 Chronic obstructive pulmonary disease, unspecified: Secondary | ICD-10-CM | POA: Diagnosis not present

## 2023-12-25 DIAGNOSIS — D6869 Other thrombophilia: Secondary | ICD-10-CM | POA: Diagnosis not present

## 2023-12-25 DIAGNOSIS — Z7901 Long term (current) use of anticoagulants: Secondary | ICD-10-CM | POA: Diagnosis not present

## 2023-12-25 DIAGNOSIS — G4733 Obstructive sleep apnea (adult) (pediatric): Secondary | ICD-10-CM | POA: Diagnosis not present

## 2023-12-25 DIAGNOSIS — K5730 Diverticulosis of large intestine without perforation or abscess without bleeding: Secondary | ICD-10-CM | POA: Diagnosis not present

## 2023-12-25 DIAGNOSIS — I129 Hypertensive chronic kidney disease with stage 1 through stage 4 chronic kidney disease, or unspecified chronic kidney disease: Secondary | ICD-10-CM | POA: Diagnosis not present

## 2023-12-25 DIAGNOSIS — R7303 Prediabetes: Secondary | ICD-10-CM | POA: Diagnosis not present

## 2023-12-25 DIAGNOSIS — I358 Other nonrheumatic aortic valve disorders: Secondary | ICD-10-CM | POA: Diagnosis not present

## 2023-12-25 DIAGNOSIS — Z79899 Other long term (current) drug therapy: Secondary | ICD-10-CM | POA: Diagnosis not present

## 2023-12-25 DIAGNOSIS — E785 Hyperlipidemia, unspecified: Secondary | ICD-10-CM | POA: Diagnosis not present

## 2023-12-26 DIAGNOSIS — I10 Essential (primary) hypertension: Secondary | ICD-10-CM | POA: Diagnosis not present

## 2023-12-26 DIAGNOSIS — D6869 Other thrombophilia: Secondary | ICD-10-CM | POA: Diagnosis not present

## 2023-12-27 DIAGNOSIS — Z7901 Long term (current) use of anticoagulants: Secondary | ICD-10-CM | POA: Diagnosis not present

## 2023-12-27 DIAGNOSIS — K5730 Diverticulosis of large intestine without perforation or abscess without bleeding: Secondary | ICD-10-CM | POA: Diagnosis not present

## 2023-12-27 DIAGNOSIS — I4819 Other persistent atrial fibrillation: Secondary | ICD-10-CM | POA: Diagnosis not present

## 2023-12-27 DIAGNOSIS — I358 Other nonrheumatic aortic valve disorders: Secondary | ICD-10-CM | POA: Diagnosis not present

## 2023-12-27 DIAGNOSIS — J449 Chronic obstructive pulmonary disease, unspecified: Secondary | ICD-10-CM | POA: Diagnosis not present

## 2023-12-27 DIAGNOSIS — G4733 Obstructive sleep apnea (adult) (pediatric): Secondary | ICD-10-CM | POA: Diagnosis not present

## 2023-12-27 DIAGNOSIS — R7303 Prediabetes: Secondary | ICD-10-CM | POA: Diagnosis not present

## 2023-12-27 DIAGNOSIS — E785 Hyperlipidemia, unspecified: Secondary | ICD-10-CM | POA: Diagnosis not present

## 2023-12-27 DIAGNOSIS — I129 Hypertensive chronic kidney disease with stage 1 through stage 4 chronic kidney disease, or unspecified chronic kidney disease: Secondary | ICD-10-CM | POA: Diagnosis not present

## 2023-12-27 DIAGNOSIS — K219 Gastro-esophageal reflux disease without esophagitis: Secondary | ICD-10-CM | POA: Diagnosis not present

## 2023-12-27 DIAGNOSIS — D6869 Other thrombophilia: Secondary | ICD-10-CM | POA: Diagnosis not present

## 2023-12-27 DIAGNOSIS — N182 Chronic kidney disease, stage 2 (mild): Secondary | ICD-10-CM | POA: Diagnosis not present

## 2023-12-27 DIAGNOSIS — E871 Hypo-osmolality and hyponatremia: Secondary | ICD-10-CM | POA: Diagnosis not present

## 2023-12-27 DIAGNOSIS — Z79899 Other long term (current) drug therapy: Secondary | ICD-10-CM | POA: Diagnosis not present

## 2023-12-28 DIAGNOSIS — J0100 Acute maxillary sinusitis, unspecified: Secondary | ICD-10-CM | POA: Diagnosis not present

## 2023-12-28 DIAGNOSIS — Z6825 Body mass index (BMI) 25.0-25.9, adult: Secondary | ICD-10-CM | POA: Diagnosis not present

## 2023-12-28 DIAGNOSIS — E871 Hypo-osmolality and hyponatremia: Secondary | ICD-10-CM | POA: Diagnosis not present

## 2023-12-28 DIAGNOSIS — Z789 Other specified health status: Secondary | ICD-10-CM | POA: Diagnosis not present

## 2023-12-28 DIAGNOSIS — I951 Orthostatic hypotension: Secondary | ICD-10-CM | POA: Diagnosis not present

## 2024-01-01 DIAGNOSIS — I129 Hypertensive chronic kidney disease with stage 1 through stage 4 chronic kidney disease, or unspecified chronic kidney disease: Secondary | ICD-10-CM | POA: Diagnosis not present

## 2024-01-01 DIAGNOSIS — R7303 Prediabetes: Secondary | ICD-10-CM | POA: Diagnosis not present

## 2024-01-01 DIAGNOSIS — N182 Chronic kidney disease, stage 2 (mild): Secondary | ICD-10-CM | POA: Diagnosis not present

## 2024-01-01 DIAGNOSIS — G4733 Obstructive sleep apnea (adult) (pediatric): Secondary | ICD-10-CM | POA: Diagnosis not present

## 2024-01-01 DIAGNOSIS — D6869 Other thrombophilia: Secondary | ICD-10-CM | POA: Diagnosis not present

## 2024-01-01 DIAGNOSIS — I4819 Other persistent atrial fibrillation: Secondary | ICD-10-CM | POA: Diagnosis not present

## 2024-01-01 DIAGNOSIS — I358 Other nonrheumatic aortic valve disorders: Secondary | ICD-10-CM | POA: Diagnosis not present

## 2024-01-01 DIAGNOSIS — E871 Hypo-osmolality and hyponatremia: Secondary | ICD-10-CM | POA: Diagnosis not present

## 2024-01-01 DIAGNOSIS — K5730 Diverticulosis of large intestine without perforation or abscess without bleeding: Secondary | ICD-10-CM | POA: Diagnosis not present

## 2024-01-01 DIAGNOSIS — Z7901 Long term (current) use of anticoagulants: Secondary | ICD-10-CM | POA: Diagnosis not present

## 2024-01-01 DIAGNOSIS — K219 Gastro-esophageal reflux disease without esophagitis: Secondary | ICD-10-CM | POA: Diagnosis not present

## 2024-01-01 DIAGNOSIS — E785 Hyperlipidemia, unspecified: Secondary | ICD-10-CM | POA: Diagnosis not present

## 2024-01-01 DIAGNOSIS — J449 Chronic obstructive pulmonary disease, unspecified: Secondary | ICD-10-CM | POA: Diagnosis not present

## 2024-01-01 DIAGNOSIS — Z79899 Other long term (current) drug therapy: Secondary | ICD-10-CM | POA: Diagnosis not present

## 2024-01-03 DIAGNOSIS — I4819 Other persistent atrial fibrillation: Secondary | ICD-10-CM | POA: Diagnosis not present

## 2024-01-03 DIAGNOSIS — Z79899 Other long term (current) drug therapy: Secondary | ICD-10-CM | POA: Diagnosis not present

## 2024-01-03 DIAGNOSIS — R7303 Prediabetes: Secondary | ICD-10-CM | POA: Diagnosis not present

## 2024-01-03 DIAGNOSIS — K5730 Diverticulosis of large intestine without perforation or abscess without bleeding: Secondary | ICD-10-CM | POA: Diagnosis not present

## 2024-01-03 DIAGNOSIS — I358 Other nonrheumatic aortic valve disorders: Secondary | ICD-10-CM | POA: Diagnosis not present

## 2024-01-03 DIAGNOSIS — E871 Hypo-osmolality and hyponatremia: Secondary | ICD-10-CM | POA: Diagnosis not present

## 2024-01-03 DIAGNOSIS — I129 Hypertensive chronic kidney disease with stage 1 through stage 4 chronic kidney disease, or unspecified chronic kidney disease: Secondary | ICD-10-CM | POA: Diagnosis not present

## 2024-01-03 DIAGNOSIS — J449 Chronic obstructive pulmonary disease, unspecified: Secondary | ICD-10-CM | POA: Diagnosis not present

## 2024-01-03 DIAGNOSIS — N182 Chronic kidney disease, stage 2 (mild): Secondary | ICD-10-CM | POA: Diagnosis not present

## 2024-01-03 DIAGNOSIS — Z7901 Long term (current) use of anticoagulants: Secondary | ICD-10-CM | POA: Diagnosis not present

## 2024-01-03 DIAGNOSIS — D6869 Other thrombophilia: Secondary | ICD-10-CM | POA: Diagnosis not present

## 2024-01-03 DIAGNOSIS — E785 Hyperlipidemia, unspecified: Secondary | ICD-10-CM | POA: Diagnosis not present

## 2024-01-03 DIAGNOSIS — G4733 Obstructive sleep apnea (adult) (pediatric): Secondary | ICD-10-CM | POA: Diagnosis not present

## 2024-01-03 DIAGNOSIS — K219 Gastro-esophageal reflux disease without esophagitis: Secondary | ICD-10-CM | POA: Diagnosis not present

## 2024-01-08 DIAGNOSIS — Z7901 Long term (current) use of anticoagulants: Secondary | ICD-10-CM | POA: Diagnosis not present

## 2024-01-08 DIAGNOSIS — E871 Hypo-osmolality and hyponatremia: Secondary | ICD-10-CM | POA: Diagnosis not present

## 2024-01-08 DIAGNOSIS — D6869 Other thrombophilia: Secondary | ICD-10-CM | POA: Diagnosis not present

## 2024-01-08 DIAGNOSIS — N182 Chronic kidney disease, stage 2 (mild): Secondary | ICD-10-CM | POA: Diagnosis not present

## 2024-01-08 DIAGNOSIS — I129 Hypertensive chronic kidney disease with stage 1 through stage 4 chronic kidney disease, or unspecified chronic kidney disease: Secondary | ICD-10-CM | POA: Diagnosis not present

## 2024-01-08 DIAGNOSIS — I4819 Other persistent atrial fibrillation: Secondary | ICD-10-CM | POA: Diagnosis not present

## 2024-01-08 DIAGNOSIS — R7303 Prediabetes: Secondary | ICD-10-CM | POA: Diagnosis not present

## 2024-01-08 DIAGNOSIS — Z79899 Other long term (current) drug therapy: Secondary | ICD-10-CM | POA: Diagnosis not present

## 2024-01-08 DIAGNOSIS — G4733 Obstructive sleep apnea (adult) (pediatric): Secondary | ICD-10-CM | POA: Diagnosis not present

## 2024-01-08 DIAGNOSIS — K219 Gastro-esophageal reflux disease without esophagitis: Secondary | ICD-10-CM | POA: Diagnosis not present

## 2024-01-08 DIAGNOSIS — K5730 Diverticulosis of large intestine without perforation or abscess without bleeding: Secondary | ICD-10-CM | POA: Diagnosis not present

## 2024-01-08 DIAGNOSIS — J449 Chronic obstructive pulmonary disease, unspecified: Secondary | ICD-10-CM | POA: Diagnosis not present

## 2024-01-08 DIAGNOSIS — I358 Other nonrheumatic aortic valve disorders: Secondary | ICD-10-CM | POA: Diagnosis not present

## 2024-01-08 DIAGNOSIS — E785 Hyperlipidemia, unspecified: Secondary | ICD-10-CM | POA: Diagnosis not present

## 2024-01-10 DIAGNOSIS — E785 Hyperlipidemia, unspecified: Secondary | ICD-10-CM | POA: Diagnosis not present

## 2024-01-10 DIAGNOSIS — I4819 Other persistent atrial fibrillation: Secondary | ICD-10-CM | POA: Diagnosis not present

## 2024-01-10 DIAGNOSIS — N182 Chronic kidney disease, stage 2 (mild): Secondary | ICD-10-CM | POA: Diagnosis not present

## 2024-01-10 DIAGNOSIS — I358 Other nonrheumatic aortic valve disorders: Secondary | ICD-10-CM | POA: Diagnosis not present

## 2024-01-10 DIAGNOSIS — Z79899 Other long term (current) drug therapy: Secondary | ICD-10-CM | POA: Diagnosis not present

## 2024-01-10 DIAGNOSIS — J449 Chronic obstructive pulmonary disease, unspecified: Secondary | ICD-10-CM | POA: Diagnosis not present

## 2024-01-10 DIAGNOSIS — I129 Hypertensive chronic kidney disease with stage 1 through stage 4 chronic kidney disease, or unspecified chronic kidney disease: Secondary | ICD-10-CM | POA: Diagnosis not present

## 2024-01-10 DIAGNOSIS — R7303 Prediabetes: Secondary | ICD-10-CM | POA: Diagnosis not present

## 2024-01-10 DIAGNOSIS — E871 Hypo-osmolality and hyponatremia: Secondary | ICD-10-CM | POA: Diagnosis not present

## 2024-01-10 DIAGNOSIS — G4733 Obstructive sleep apnea (adult) (pediatric): Secondary | ICD-10-CM | POA: Diagnosis not present

## 2024-01-10 DIAGNOSIS — K219 Gastro-esophageal reflux disease without esophagitis: Secondary | ICD-10-CM | POA: Diagnosis not present

## 2024-01-10 DIAGNOSIS — Z7901 Long term (current) use of anticoagulants: Secondary | ICD-10-CM | POA: Diagnosis not present

## 2024-01-10 DIAGNOSIS — K5730 Diverticulosis of large intestine without perforation or abscess without bleeding: Secondary | ICD-10-CM | POA: Diagnosis not present

## 2024-01-10 DIAGNOSIS — D6869 Other thrombophilia: Secondary | ICD-10-CM | POA: Diagnosis not present

## 2024-01-11 DIAGNOSIS — Z6826 Body mass index (BMI) 26.0-26.9, adult: Secondary | ICD-10-CM | POA: Diagnosis not present

## 2024-01-11 DIAGNOSIS — I951 Orthostatic hypotension: Secondary | ICD-10-CM | POA: Diagnosis not present

## 2024-01-11 DIAGNOSIS — I509 Heart failure, unspecified: Secondary | ICD-10-CM | POA: Diagnosis not present

## 2024-01-16 DIAGNOSIS — R918 Other nonspecific abnormal finding of lung field: Secondary | ICD-10-CM | POA: Diagnosis not present

## 2024-01-16 DIAGNOSIS — R0602 Shortness of breath: Secondary | ICD-10-CM | POA: Diagnosis not present

## 2024-01-16 DIAGNOSIS — I10 Essential (primary) hypertension: Secondary | ICD-10-CM | POA: Diagnosis not present

## 2024-01-16 DIAGNOSIS — I493 Ventricular premature depolarization: Secondary | ICD-10-CM | POA: Diagnosis not present

## 2024-01-16 DIAGNOSIS — I499 Cardiac arrhythmia, unspecified: Secondary | ICD-10-CM | POA: Diagnosis not present

## 2024-01-16 DIAGNOSIS — Z90711 Acquired absence of uterus with remaining cervical stump: Secondary | ICD-10-CM | POA: Diagnosis not present

## 2024-01-16 DIAGNOSIS — J4489 Other specified chronic obstructive pulmonary disease: Secondary | ICD-10-CM | POA: Diagnosis not present

## 2024-01-16 DIAGNOSIS — R531 Weakness: Secondary | ICD-10-CM | POA: Diagnosis not present

## 2024-01-16 DIAGNOSIS — I482 Chronic atrial fibrillation, unspecified: Secondary | ICD-10-CM | POA: Diagnosis not present

## 2024-01-16 DIAGNOSIS — I358 Other nonrheumatic aortic valve disorders: Secondary | ICD-10-CM | POA: Diagnosis not present

## 2024-01-16 DIAGNOSIS — Z9049 Acquired absence of other specified parts of digestive tract: Secondary | ICD-10-CM | POA: Diagnosis not present

## 2024-01-16 DIAGNOSIS — Z9842 Cataract extraction status, left eye: Secondary | ICD-10-CM | POA: Diagnosis not present

## 2024-01-16 DIAGNOSIS — I480 Paroxysmal atrial fibrillation: Secondary | ICD-10-CM | POA: Diagnosis not present

## 2024-01-16 DIAGNOSIS — E785 Hyperlipidemia, unspecified: Secondary | ICD-10-CM | POA: Diagnosis not present

## 2024-01-16 DIAGNOSIS — I951 Orthostatic hypotension: Secondary | ICD-10-CM | POA: Diagnosis not present

## 2024-01-16 DIAGNOSIS — I4819 Other persistent atrial fibrillation: Secondary | ICD-10-CM | POA: Diagnosis not present

## 2024-01-16 DIAGNOSIS — I440 Atrioventricular block, first degree: Secondary | ICD-10-CM | POA: Diagnosis not present

## 2024-01-16 DIAGNOSIS — Z7901 Long term (current) use of anticoagulants: Secondary | ICD-10-CM | POA: Diagnosis not present

## 2024-01-16 DIAGNOSIS — Z79899 Other long term (current) drug therapy: Secondary | ICD-10-CM | POA: Diagnosis not present

## 2024-01-16 DIAGNOSIS — I517 Cardiomegaly: Secondary | ICD-10-CM | POA: Diagnosis not present

## 2024-01-16 DIAGNOSIS — Z9841 Cataract extraction status, right eye: Secondary | ICD-10-CM | POA: Diagnosis not present

## 2024-01-16 DIAGNOSIS — Z961 Presence of intraocular lens: Secondary | ICD-10-CM | POA: Diagnosis not present

## 2024-01-16 DIAGNOSIS — Z8673 Personal history of transient ischemic attack (TIA), and cerebral infarction without residual deficits: Secondary | ICD-10-CM | POA: Diagnosis not present

## 2024-01-16 DIAGNOSIS — R002 Palpitations: Secondary | ICD-10-CM | POA: Diagnosis not present

## 2024-01-16 DIAGNOSIS — I4891 Unspecified atrial fibrillation: Secondary | ICD-10-CM | POA: Diagnosis not present

## 2024-01-19 DIAGNOSIS — I4891 Unspecified atrial fibrillation: Secondary | ICD-10-CM | POA: Diagnosis not present

## 2024-01-19 DIAGNOSIS — I480 Paroxysmal atrial fibrillation: Secondary | ICD-10-CM | POA: Diagnosis not present

## 2024-01-22 DIAGNOSIS — I4729 Other ventricular tachycardia: Secondary | ICD-10-CM | POA: Diagnosis not present

## 2024-01-22 DIAGNOSIS — I4819 Other persistent atrial fibrillation: Secondary | ICD-10-CM | POA: Diagnosis not present

## 2024-01-23 DIAGNOSIS — J449 Chronic obstructive pulmonary disease, unspecified: Secondary | ICD-10-CM | POA: Diagnosis not present

## 2024-01-23 DIAGNOSIS — R7303 Prediabetes: Secondary | ICD-10-CM | POA: Diagnosis not present

## 2024-01-23 DIAGNOSIS — K219 Gastro-esophageal reflux disease without esophagitis: Secondary | ICD-10-CM | POA: Diagnosis not present

## 2024-01-23 DIAGNOSIS — K5730 Diverticulosis of large intestine without perforation or abscess without bleeding: Secondary | ICD-10-CM | POA: Diagnosis not present

## 2024-01-23 DIAGNOSIS — E871 Hypo-osmolality and hyponatremia: Secondary | ICD-10-CM | POA: Diagnosis not present

## 2024-01-23 DIAGNOSIS — D6869 Other thrombophilia: Secondary | ICD-10-CM | POA: Diagnosis not present

## 2024-01-23 DIAGNOSIS — I129 Hypertensive chronic kidney disease with stage 1 through stage 4 chronic kidney disease, or unspecified chronic kidney disease: Secondary | ICD-10-CM | POA: Diagnosis not present

## 2024-01-23 DIAGNOSIS — Z79899 Other long term (current) drug therapy: Secondary | ICD-10-CM | POA: Diagnosis not present

## 2024-01-23 DIAGNOSIS — N182 Chronic kidney disease, stage 2 (mild): Secondary | ICD-10-CM | POA: Diagnosis not present

## 2024-01-23 DIAGNOSIS — I358 Other nonrheumatic aortic valve disorders: Secondary | ICD-10-CM | POA: Diagnosis not present

## 2024-01-23 DIAGNOSIS — I4819 Other persistent atrial fibrillation: Secondary | ICD-10-CM | POA: Diagnosis not present

## 2024-01-23 DIAGNOSIS — E785 Hyperlipidemia, unspecified: Secondary | ICD-10-CM | POA: Diagnosis not present

## 2024-01-23 DIAGNOSIS — Z7901 Long term (current) use of anticoagulants: Secondary | ICD-10-CM | POA: Diagnosis not present

## 2024-01-23 DIAGNOSIS — G4733 Obstructive sleep apnea (adult) (pediatric): Secondary | ICD-10-CM | POA: Diagnosis not present

## 2024-01-24 DIAGNOSIS — I10 Essential (primary) hypertension: Secondary | ICD-10-CM | POA: Diagnosis not present

## 2024-01-25 DIAGNOSIS — G43909 Migraine, unspecified, not intractable, without status migrainosus: Secondary | ICD-10-CM | POA: Diagnosis not present

## 2024-01-25 DIAGNOSIS — Z6826 Body mass index (BMI) 26.0-26.9, adult: Secondary | ICD-10-CM | POA: Diagnosis not present

## 2024-01-25 DIAGNOSIS — I5030 Unspecified diastolic (congestive) heart failure: Secondary | ICD-10-CM | POA: Diagnosis not present

## 2024-01-25 DIAGNOSIS — J309 Allergic rhinitis, unspecified: Secondary | ICD-10-CM | POA: Diagnosis not present

## 2024-01-25 DIAGNOSIS — Z9181 History of falling: Secondary | ICD-10-CM | POA: Diagnosis not present

## 2024-01-25 DIAGNOSIS — D6869 Other thrombophilia: Secondary | ICD-10-CM | POA: Diagnosis not present

## 2024-01-25 DIAGNOSIS — I10 Essential (primary) hypertension: Secondary | ICD-10-CM | POA: Diagnosis not present

## 2024-01-26 DIAGNOSIS — K219 Gastro-esophageal reflux disease without esophagitis: Secondary | ICD-10-CM | POA: Diagnosis not present

## 2024-01-26 DIAGNOSIS — R7303 Prediabetes: Secondary | ICD-10-CM | POA: Diagnosis not present

## 2024-01-26 DIAGNOSIS — E871 Hypo-osmolality and hyponatremia: Secondary | ICD-10-CM | POA: Diagnosis not present

## 2024-01-26 DIAGNOSIS — D6869 Other thrombophilia: Secondary | ICD-10-CM | POA: Diagnosis not present

## 2024-01-26 DIAGNOSIS — I129 Hypertensive chronic kidney disease with stage 1 through stage 4 chronic kidney disease, or unspecified chronic kidney disease: Secondary | ICD-10-CM | POA: Diagnosis not present

## 2024-01-26 DIAGNOSIS — I358 Other nonrheumatic aortic valve disorders: Secondary | ICD-10-CM | POA: Diagnosis not present

## 2024-01-26 DIAGNOSIS — N182 Chronic kidney disease, stage 2 (mild): Secondary | ICD-10-CM | POA: Diagnosis not present

## 2024-01-26 DIAGNOSIS — I4819 Other persistent atrial fibrillation: Secondary | ICD-10-CM | POA: Diagnosis not present

## 2024-01-26 DIAGNOSIS — E785 Hyperlipidemia, unspecified: Secondary | ICD-10-CM | POA: Diagnosis not present

## 2024-01-26 DIAGNOSIS — Z79899 Other long term (current) drug therapy: Secondary | ICD-10-CM | POA: Diagnosis not present

## 2024-01-26 DIAGNOSIS — Z7901 Long term (current) use of anticoagulants: Secondary | ICD-10-CM | POA: Diagnosis not present

## 2024-01-26 DIAGNOSIS — K5730 Diverticulosis of large intestine without perforation or abscess without bleeding: Secondary | ICD-10-CM | POA: Diagnosis not present

## 2024-01-26 DIAGNOSIS — J449 Chronic obstructive pulmonary disease, unspecified: Secondary | ICD-10-CM | POA: Diagnosis not present

## 2024-01-26 DIAGNOSIS — G4733 Obstructive sleep apnea (adult) (pediatric): Secondary | ICD-10-CM | POA: Diagnosis not present

## 2024-01-29 DIAGNOSIS — E871 Hypo-osmolality and hyponatremia: Secondary | ICD-10-CM | POA: Diagnosis not present

## 2024-01-29 DIAGNOSIS — K219 Gastro-esophageal reflux disease without esophagitis: Secondary | ICD-10-CM | POA: Diagnosis not present

## 2024-01-29 DIAGNOSIS — I358 Other nonrheumatic aortic valve disorders: Secondary | ICD-10-CM | POA: Diagnosis not present

## 2024-01-29 DIAGNOSIS — K5730 Diverticulosis of large intestine without perforation or abscess without bleeding: Secondary | ICD-10-CM | POA: Diagnosis not present

## 2024-01-29 DIAGNOSIS — J449 Chronic obstructive pulmonary disease, unspecified: Secondary | ICD-10-CM | POA: Diagnosis not present

## 2024-01-29 DIAGNOSIS — I129 Hypertensive chronic kidney disease with stage 1 through stage 4 chronic kidney disease, or unspecified chronic kidney disease: Secondary | ICD-10-CM | POA: Diagnosis not present

## 2024-01-29 DIAGNOSIS — E785 Hyperlipidemia, unspecified: Secondary | ICD-10-CM | POA: Diagnosis not present

## 2024-01-29 DIAGNOSIS — D6869 Other thrombophilia: Secondary | ICD-10-CM | POA: Diagnosis not present

## 2024-01-29 DIAGNOSIS — I4819 Other persistent atrial fibrillation: Secondary | ICD-10-CM | POA: Diagnosis not present

## 2024-01-29 DIAGNOSIS — N182 Chronic kidney disease, stage 2 (mild): Secondary | ICD-10-CM | POA: Diagnosis not present

## 2024-01-29 DIAGNOSIS — R7303 Prediabetes: Secondary | ICD-10-CM | POA: Diagnosis not present

## 2024-01-29 DIAGNOSIS — G4733 Obstructive sleep apnea (adult) (pediatric): Secondary | ICD-10-CM | POA: Diagnosis not present

## 2024-01-29 DIAGNOSIS — Z79899 Other long term (current) drug therapy: Secondary | ICD-10-CM | POA: Diagnosis not present

## 2024-01-29 DIAGNOSIS — Z7901 Long term (current) use of anticoagulants: Secondary | ICD-10-CM | POA: Diagnosis not present

## 2024-02-01 DIAGNOSIS — G4733 Obstructive sleep apnea (adult) (pediatric): Secondary | ICD-10-CM | POA: Diagnosis not present

## 2024-02-01 DIAGNOSIS — I358 Other nonrheumatic aortic valve disorders: Secondary | ICD-10-CM | POA: Diagnosis not present

## 2024-02-01 DIAGNOSIS — K219 Gastro-esophageal reflux disease without esophagitis: Secondary | ICD-10-CM | POA: Diagnosis not present

## 2024-02-01 DIAGNOSIS — K5730 Diverticulosis of large intestine without perforation or abscess without bleeding: Secondary | ICD-10-CM | POA: Diagnosis not present

## 2024-02-01 DIAGNOSIS — R7303 Prediabetes: Secondary | ICD-10-CM | POA: Diagnosis not present

## 2024-02-01 DIAGNOSIS — I4819 Other persistent atrial fibrillation: Secondary | ICD-10-CM | POA: Diagnosis not present

## 2024-02-01 DIAGNOSIS — J449 Chronic obstructive pulmonary disease, unspecified: Secondary | ICD-10-CM | POA: Diagnosis not present

## 2024-02-01 DIAGNOSIS — N182 Chronic kidney disease, stage 2 (mild): Secondary | ICD-10-CM | POA: Diagnosis not present

## 2024-02-01 DIAGNOSIS — Z7901 Long term (current) use of anticoagulants: Secondary | ICD-10-CM | POA: Diagnosis not present

## 2024-02-01 DIAGNOSIS — E785 Hyperlipidemia, unspecified: Secondary | ICD-10-CM | POA: Diagnosis not present

## 2024-02-01 DIAGNOSIS — Z79899 Other long term (current) drug therapy: Secondary | ICD-10-CM | POA: Diagnosis not present

## 2024-02-01 DIAGNOSIS — D6869 Other thrombophilia: Secondary | ICD-10-CM | POA: Diagnosis not present

## 2024-02-01 DIAGNOSIS — E871 Hypo-osmolality and hyponatremia: Secondary | ICD-10-CM | POA: Diagnosis not present

## 2024-02-01 DIAGNOSIS — I129 Hypertensive chronic kidney disease with stage 1 through stage 4 chronic kidney disease, or unspecified chronic kidney disease: Secondary | ICD-10-CM | POA: Diagnosis not present

## 2024-02-05 DIAGNOSIS — K5730 Diverticulosis of large intestine without perforation or abscess without bleeding: Secondary | ICD-10-CM | POA: Diagnosis not present

## 2024-02-05 DIAGNOSIS — N182 Chronic kidney disease, stage 2 (mild): Secondary | ICD-10-CM | POA: Diagnosis not present

## 2024-02-05 DIAGNOSIS — I4819 Other persistent atrial fibrillation: Secondary | ICD-10-CM | POA: Diagnosis not present

## 2024-02-05 DIAGNOSIS — G4733 Obstructive sleep apnea (adult) (pediatric): Secondary | ICD-10-CM | POA: Diagnosis not present

## 2024-02-05 DIAGNOSIS — Z7901 Long term (current) use of anticoagulants: Secondary | ICD-10-CM | POA: Diagnosis not present

## 2024-02-05 DIAGNOSIS — E785 Hyperlipidemia, unspecified: Secondary | ICD-10-CM | POA: Diagnosis not present

## 2024-02-05 DIAGNOSIS — J449 Chronic obstructive pulmonary disease, unspecified: Secondary | ICD-10-CM | POA: Diagnosis not present

## 2024-02-05 DIAGNOSIS — I129 Hypertensive chronic kidney disease with stage 1 through stage 4 chronic kidney disease, or unspecified chronic kidney disease: Secondary | ICD-10-CM | POA: Diagnosis not present

## 2024-02-05 DIAGNOSIS — R7303 Prediabetes: Secondary | ICD-10-CM | POA: Diagnosis not present

## 2024-02-05 DIAGNOSIS — K219 Gastro-esophageal reflux disease without esophagitis: Secondary | ICD-10-CM | POA: Diagnosis not present

## 2024-02-05 DIAGNOSIS — E871 Hypo-osmolality and hyponatremia: Secondary | ICD-10-CM | POA: Diagnosis not present

## 2024-02-05 DIAGNOSIS — D6869 Other thrombophilia: Secondary | ICD-10-CM | POA: Diagnosis not present

## 2024-02-05 DIAGNOSIS — I358 Other nonrheumatic aortic valve disorders: Secondary | ICD-10-CM | POA: Diagnosis not present

## 2024-02-05 DIAGNOSIS — Z79899 Other long term (current) drug therapy: Secondary | ICD-10-CM | POA: Diagnosis not present

## 2024-02-08 DIAGNOSIS — R000 Tachycardia, unspecified: Secondary | ICD-10-CM | POA: Diagnosis not present

## 2024-02-08 DIAGNOSIS — R079 Chest pain, unspecified: Secondary | ICD-10-CM | POA: Diagnosis not present

## 2024-02-12 DIAGNOSIS — I083 Combined rheumatic disorders of mitral, aortic and tricuspid valves: Secondary | ICD-10-CM | POA: Diagnosis not present

## 2024-02-12 DIAGNOSIS — Z01810 Encounter for preprocedural cardiovascular examination: Secondary | ICD-10-CM | POA: Diagnosis not present

## 2024-02-12 DIAGNOSIS — R931 Abnormal findings on diagnostic imaging of heart and coronary circulation: Secondary | ICD-10-CM | POA: Diagnosis not present

## 2024-02-12 DIAGNOSIS — I4819 Other persistent atrial fibrillation: Secondary | ICD-10-CM | POA: Diagnosis not present

## 2024-02-12 DIAGNOSIS — Q2119 Other specified atrial septal defect: Secondary | ICD-10-CM | POA: Diagnosis not present

## 2024-02-13 DIAGNOSIS — I480 Paroxysmal atrial fibrillation: Secondary | ICD-10-CM | POA: Diagnosis not present

## 2024-02-13 DIAGNOSIS — I4819 Other persistent atrial fibrillation: Secondary | ICD-10-CM | POA: Diagnosis not present

## 2024-02-13 DIAGNOSIS — I10 Essential (primary) hypertension: Secondary | ICD-10-CM | POA: Diagnosis not present

## 2024-02-13 DIAGNOSIS — R0602 Shortness of breath: Secondary | ICD-10-CM | POA: Diagnosis not present

## 2024-02-13 DIAGNOSIS — Z885 Allergy status to narcotic agent status: Secondary | ICD-10-CM | POA: Diagnosis not present

## 2024-02-13 DIAGNOSIS — Z7901 Long term (current) use of anticoagulants: Secondary | ICD-10-CM | POA: Diagnosis not present

## 2024-02-13 DIAGNOSIS — E785 Hyperlipidemia, unspecified: Secondary | ICD-10-CM | POA: Diagnosis not present

## 2024-02-13 DIAGNOSIS — I358 Other nonrheumatic aortic valve disorders: Secondary | ICD-10-CM | POA: Diagnosis not present

## 2024-02-13 DIAGNOSIS — J45909 Unspecified asthma, uncomplicated: Secondary | ICD-10-CM | POA: Diagnosis not present

## 2024-02-13 DIAGNOSIS — Z882 Allergy status to sulfonamides status: Secondary | ICD-10-CM | POA: Diagnosis not present

## 2024-02-13 DIAGNOSIS — Z881 Allergy status to other antibiotic agents status: Secondary | ICD-10-CM | POA: Diagnosis not present

## 2024-02-13 DIAGNOSIS — Z880 Allergy status to penicillin: Secondary | ICD-10-CM | POA: Diagnosis not present

## 2024-02-14 DIAGNOSIS — R9431 Abnormal electrocardiogram [ECG] [EKG]: Secondary | ICD-10-CM | POA: Diagnosis not present

## 2024-02-14 DIAGNOSIS — I4819 Other persistent atrial fibrillation: Secondary | ICD-10-CM | POA: Diagnosis not present

## 2024-02-14 DIAGNOSIS — I2109 ST elevation (STEMI) myocardial infarction involving other coronary artery of anterior wall: Secondary | ICD-10-CM | POA: Diagnosis not present

## 2024-02-14 DIAGNOSIS — I358 Other nonrheumatic aortic valve disorders: Secondary | ICD-10-CM | POA: Diagnosis not present

## 2024-02-14 DIAGNOSIS — I10 Essential (primary) hypertension: Secondary | ICD-10-CM | POA: Diagnosis not present

## 2024-02-14 DIAGNOSIS — I499 Cardiac arrhythmia, unspecified: Secondary | ICD-10-CM | POA: Diagnosis not present

## 2024-02-14 DIAGNOSIS — J45909 Unspecified asthma, uncomplicated: Secondary | ICD-10-CM | POA: Diagnosis not present

## 2024-02-14 DIAGNOSIS — E785 Hyperlipidemia, unspecified: Secondary | ICD-10-CM | POA: Diagnosis not present

## 2024-02-14 DIAGNOSIS — Z7901 Long term (current) use of anticoagulants: Secondary | ICD-10-CM | POA: Diagnosis not present

## 2024-02-14 DIAGNOSIS — R0602 Shortness of breath: Secondary | ICD-10-CM | POA: Diagnosis not present

## 2024-02-14 DIAGNOSIS — Z885 Allergy status to narcotic agent status: Secondary | ICD-10-CM | POA: Diagnosis not present

## 2024-02-14 DIAGNOSIS — Z882 Allergy status to sulfonamides status: Secondary | ICD-10-CM | POA: Diagnosis not present

## 2024-02-14 DIAGNOSIS — Z880 Allergy status to penicillin: Secondary | ICD-10-CM | POA: Diagnosis not present

## 2024-02-14 DIAGNOSIS — Z881 Allergy status to other antibiotic agents status: Secondary | ICD-10-CM | POA: Diagnosis not present

## 2024-02-16 DIAGNOSIS — I129 Hypertensive chronic kidney disease with stage 1 through stage 4 chronic kidney disease, or unspecified chronic kidney disease: Secondary | ICD-10-CM | POA: Diagnosis not present

## 2024-02-16 DIAGNOSIS — K219 Gastro-esophageal reflux disease without esophagitis: Secondary | ICD-10-CM | POA: Diagnosis not present

## 2024-02-16 DIAGNOSIS — G4733 Obstructive sleep apnea (adult) (pediatric): Secondary | ICD-10-CM | POA: Diagnosis not present

## 2024-02-16 DIAGNOSIS — I358 Other nonrheumatic aortic valve disorders: Secondary | ICD-10-CM | POA: Diagnosis not present

## 2024-02-16 DIAGNOSIS — E785 Hyperlipidemia, unspecified: Secondary | ICD-10-CM | POA: Diagnosis not present

## 2024-02-16 DIAGNOSIS — I4819 Other persistent atrial fibrillation: Secondary | ICD-10-CM | POA: Diagnosis not present

## 2024-02-16 DIAGNOSIS — N182 Chronic kidney disease, stage 2 (mild): Secondary | ICD-10-CM | POA: Diagnosis not present

## 2024-02-16 DIAGNOSIS — J449 Chronic obstructive pulmonary disease, unspecified: Secondary | ICD-10-CM | POA: Diagnosis not present

## 2024-02-16 DIAGNOSIS — E871 Hypo-osmolality and hyponatremia: Secondary | ICD-10-CM | POA: Diagnosis not present

## 2024-02-16 DIAGNOSIS — Z79899 Other long term (current) drug therapy: Secondary | ICD-10-CM | POA: Diagnosis not present

## 2024-02-16 DIAGNOSIS — D6869 Other thrombophilia: Secondary | ICD-10-CM | POA: Diagnosis not present

## 2024-02-16 DIAGNOSIS — Z7901 Long term (current) use of anticoagulants: Secondary | ICD-10-CM | POA: Diagnosis not present

## 2024-02-16 DIAGNOSIS — R7303 Prediabetes: Secondary | ICD-10-CM | POA: Diagnosis not present

## 2024-02-16 DIAGNOSIS — K5730 Diverticulosis of large intestine without perforation or abscess without bleeding: Secondary | ICD-10-CM | POA: Diagnosis not present

## 2024-02-22 DIAGNOSIS — R296 Repeated falls: Secondary | ICD-10-CM | POA: Diagnosis not present

## 2024-02-22 DIAGNOSIS — I951 Orthostatic hypotension: Secondary | ICD-10-CM | POA: Diagnosis not present

## 2024-02-22 DIAGNOSIS — Z0000 Encounter for general adult medical examination without abnormal findings: Secondary | ICD-10-CM | POA: Diagnosis not present

## 2024-02-22 DIAGNOSIS — Z1331 Encounter for screening for depression: Secondary | ICD-10-CM | POA: Diagnosis not present

## 2024-02-22 DIAGNOSIS — Z136 Encounter for screening for cardiovascular disorders: Secondary | ICD-10-CM | POA: Diagnosis not present

## 2024-02-22 DIAGNOSIS — Z1339 Encounter for screening examination for other mental health and behavioral disorders: Secondary | ICD-10-CM | POA: Diagnosis not present

## 2024-02-22 DIAGNOSIS — Z139 Encounter for screening, unspecified: Secondary | ICD-10-CM | POA: Diagnosis not present

## 2024-02-22 DIAGNOSIS — Z1389 Encounter for screening for other disorder: Secondary | ICD-10-CM | POA: Diagnosis not present

## 2024-02-22 DIAGNOSIS — Z6825 Body mass index (BMI) 25.0-25.9, adult: Secondary | ICD-10-CM | POA: Diagnosis not present

## 2024-02-24 DIAGNOSIS — I10 Essential (primary) hypertension: Secondary | ICD-10-CM | POA: Diagnosis not present

## 2024-02-25 DIAGNOSIS — D6869 Other thrombophilia: Secondary | ICD-10-CM | POA: Diagnosis not present

## 2024-02-25 DIAGNOSIS — I10 Essential (primary) hypertension: Secondary | ICD-10-CM | POA: Diagnosis not present

## 2024-03-08 ENCOUNTER — Other Ambulatory Visit: Payer: Self-pay | Admitting: Family Medicine

## 2024-03-08 DIAGNOSIS — Z1231 Encounter for screening mammogram for malignant neoplasm of breast: Secondary | ICD-10-CM

## 2024-03-25 DIAGNOSIS — I10 Essential (primary) hypertension: Secondary | ICD-10-CM | POA: Diagnosis not present

## 2024-03-26 DIAGNOSIS — Z9181 History of falling: Secondary | ICD-10-CM | POA: Diagnosis not present

## 2024-03-26 DIAGNOSIS — D6869 Other thrombophilia: Secondary | ICD-10-CM | POA: Diagnosis not present

## 2024-03-26 DIAGNOSIS — I10 Essential (primary) hypertension: Secondary | ICD-10-CM | POA: Diagnosis not present

## 2024-04-19 DIAGNOSIS — M7061 Trochanteric bursitis, right hip: Secondary | ICD-10-CM | POA: Diagnosis not present

## 2024-04-19 DIAGNOSIS — M7062 Trochanteric bursitis, left hip: Secondary | ICD-10-CM | POA: Diagnosis not present

## 2024-04-25 DIAGNOSIS — I10 Essential (primary) hypertension: Secondary | ICD-10-CM | POA: Diagnosis not present

## 2024-04-26 DIAGNOSIS — D6869 Other thrombophilia: Secondary | ICD-10-CM | POA: Diagnosis not present

## 2024-04-26 DIAGNOSIS — I10 Essential (primary) hypertension: Secondary | ICD-10-CM | POA: Diagnosis not present

## 2024-05-02 DIAGNOSIS — K080 Exfoliation of teeth due to systemic causes: Secondary | ICD-10-CM | POA: Diagnosis not present

## 2024-05-08 ENCOUNTER — Ambulatory Visit
Admission: RE | Admit: 2024-05-08 | Discharge: 2024-05-08 | Disposition: A | Discharge status: Home / Self Care | Source: Ambulatory Visit | Attending: Family Medicine | Admitting: Family Medicine

## 2024-05-08 DIAGNOSIS — Z1231 Encounter for screening mammogram for malignant neoplasm of breast: Secondary | ICD-10-CM

## 2024-05-23 DIAGNOSIS — D2239 Melanocytic nevi of other parts of face: Secondary | ICD-10-CM | POA: Diagnosis not present

## 2024-05-23 DIAGNOSIS — L820 Inflamed seborrheic keratosis: Secondary | ICD-10-CM | POA: Diagnosis not present

## 2024-05-23 DIAGNOSIS — R233 Spontaneous ecchymoses: Secondary | ICD-10-CM | POA: Diagnosis not present

## 2024-05-23 DIAGNOSIS — L821 Other seborrheic keratosis: Secondary | ICD-10-CM | POA: Diagnosis not present

## 2024-05-23 DIAGNOSIS — D225 Melanocytic nevi of trunk: Secondary | ICD-10-CM | POA: Diagnosis not present

## 2024-05-26 DIAGNOSIS — I10 Essential (primary) hypertension: Secondary | ICD-10-CM | POA: Diagnosis not present

## 2024-05-27 DIAGNOSIS — I10 Essential (primary) hypertension: Secondary | ICD-10-CM | POA: Diagnosis not present

## 2024-05-27 DIAGNOSIS — D6869 Other thrombophilia: Secondary | ICD-10-CM | POA: Diagnosis not present

## 2024-05-30 DIAGNOSIS — I4819 Other persistent atrial fibrillation: Secondary | ICD-10-CM | POA: Diagnosis not present

## 2024-05-30 DIAGNOSIS — Z9889 Other specified postprocedural states: Secondary | ICD-10-CM | POA: Diagnosis not present

## 2024-05-30 DIAGNOSIS — I358 Other nonrheumatic aortic valve disorders: Secondary | ICD-10-CM | POA: Diagnosis not present

## 2024-05-30 DIAGNOSIS — I10 Essential (primary) hypertension: Secondary | ICD-10-CM | POA: Diagnosis not present

## 2024-05-30 DIAGNOSIS — Z136 Encounter for screening for cardiovascular disorders: Secondary | ICD-10-CM | POA: Diagnosis not present

## 2024-06-03 DIAGNOSIS — I4819 Other persistent atrial fibrillation: Secondary | ICD-10-CM | POA: Diagnosis not present

## 2024-06-19 DIAGNOSIS — I472 Ventricular tachycardia, unspecified: Secondary | ICD-10-CM | POA: Diagnosis not present

## 2024-06-19 DIAGNOSIS — I4891 Unspecified atrial fibrillation: Secondary | ICD-10-CM | POA: Diagnosis not present

## 2024-06-19 DIAGNOSIS — Z8679 Personal history of other diseases of the circulatory system: Secondary | ICD-10-CM | POA: Diagnosis not present

## 2024-06-19 DIAGNOSIS — I4819 Other persistent atrial fibrillation: Secondary | ICD-10-CM | POA: Diagnosis not present

## 2024-06-19 DIAGNOSIS — I4719 Other supraventricular tachycardia: Secondary | ICD-10-CM | POA: Diagnosis not present

## 2024-06-25 DIAGNOSIS — I10 Essential (primary) hypertension: Secondary | ICD-10-CM | POA: Diagnosis not present

## 2024-06-26 DIAGNOSIS — D6869 Other thrombophilia: Secondary | ICD-10-CM | POA: Diagnosis not present

## 2024-06-26 DIAGNOSIS — I10 Essential (primary) hypertension: Secondary | ICD-10-CM | POA: Diagnosis not present

## 2024-07-09 DIAGNOSIS — R1030 Lower abdominal pain, unspecified: Secondary | ICD-10-CM | POA: Diagnosis not present

## 2024-07-09 DIAGNOSIS — N182 Chronic kidney disease, stage 2 (mild): Secondary | ICD-10-CM | POA: Diagnosis not present

## 2024-07-09 DIAGNOSIS — Z6826 Body mass index (BMI) 26.0-26.9, adult: Secondary | ICD-10-CM | POA: Diagnosis not present

## 2024-07-11 DIAGNOSIS — R10A1 Flank pain, right side: Secondary | ICD-10-CM | POA: Diagnosis not present

## 2024-07-11 DIAGNOSIS — N182 Chronic kidney disease, stage 2 (mild): Secondary | ICD-10-CM | POA: Diagnosis not present

## 2024-07-16 DIAGNOSIS — E785 Hyperlipidemia, unspecified: Secondary | ICD-10-CM | POA: Diagnosis not present

## 2024-07-16 DIAGNOSIS — R7303 Prediabetes: Secondary | ICD-10-CM | POA: Diagnosis not present

## 2024-07-24 DIAGNOSIS — D485 Neoplasm of uncertain behavior of skin: Secondary | ICD-10-CM | POA: Diagnosis not present

## 2024-07-26 DIAGNOSIS — I10 Essential (primary) hypertension: Secondary | ICD-10-CM | POA: Diagnosis not present

## 2024-07-27 DIAGNOSIS — I10 Essential (primary) hypertension: Secondary | ICD-10-CM | POA: Diagnosis not present

## 2024-07-27 DIAGNOSIS — D6869 Other thrombophilia: Secondary | ICD-10-CM | POA: Diagnosis not present

## 2024-08-07 DIAGNOSIS — J449 Chronic obstructive pulmonary disease, unspecified: Secondary | ICD-10-CM | POA: Diagnosis not present

## 2024-08-07 DIAGNOSIS — R051 Acute cough: Secondary | ICD-10-CM | POA: Diagnosis not present

## 2024-08-07 DIAGNOSIS — J069 Acute upper respiratory infection, unspecified: Secondary | ICD-10-CM | POA: Diagnosis not present

## 2024-08-15 DIAGNOSIS — Z961 Presence of intraocular lens: Secondary | ICD-10-CM | POA: Diagnosis not present

## 2024-08-25 DIAGNOSIS — I10 Essential (primary) hypertension: Secondary | ICD-10-CM | POA: Diagnosis not present
